# Patient Record
Sex: Female | Born: 1965
Health system: Southern US, Community
[De-identification: ages and names within clinical notes are randomized; demographics above are authoritative.]

## PROBLEM LIST (undated history)

## (undated) DIAGNOSIS — F419 Anxiety disorder, unspecified: Secondary | ICD-10-CM

## (undated) DIAGNOSIS — E785 Hyperlipidemia, unspecified: Secondary | ICD-10-CM

## (undated) DIAGNOSIS — M1990 Unspecified osteoarthritis, unspecified site: Secondary | ICD-10-CM

## (undated) DIAGNOSIS — Z8249 Family history of ischemic heart disease and other diseases of the circulatory system: Secondary | ICD-10-CM

## (undated) DIAGNOSIS — I10 Essential (primary) hypertension: Secondary | ICD-10-CM

## (undated) DIAGNOSIS — Z136 Encounter for screening for cardiovascular disorders: Secondary | ICD-10-CM

## (undated) DIAGNOSIS — J019 Acute sinusitis, unspecified: Secondary | ICD-10-CM

## (undated) DIAGNOSIS — R011 Cardiac murmur, unspecified: Secondary | ICD-10-CM

## (undated) DIAGNOSIS — R030 Elevated blood-pressure reading, without diagnosis of hypertension: Secondary | ICD-10-CM

## (undated) DIAGNOSIS — F32A Depression, unspecified: Secondary | ICD-10-CM

## (undated) DIAGNOSIS — F329 Major depressive disorder, single episode, unspecified: Secondary | ICD-10-CM

## (undated) DIAGNOSIS — E559 Vitamin D deficiency, unspecified: Secondary | ICD-10-CM

## (undated) DIAGNOSIS — K219 Gastro-esophageal reflux disease without esophagitis: Secondary | ICD-10-CM

## (undated) HISTORY — DX: Encounter for screening for cardiovascular disorders: Z13.6

## (undated) HISTORY — DX: Acute sinusitis, unspecified: J01.90

## (undated) HISTORY — DX: Vitamin D deficiency, unspecified: E55.9

## (undated) HISTORY — DX: Depression, unspecified: F32.A

## (undated) HISTORY — DX: Gastro-esophageal reflux disease without esophagitis: K21.9

## (undated) HISTORY — PX: CHOLECYSTECTOMY: SHX55

## (undated) HISTORY — DX: Hyperlipidemia, unspecified: E78.5

## (undated) HISTORY — DX: Elevated blood-pressure reading, without diagnosis of hypertension: R03.0

## (undated) HISTORY — DX: Family history of ischemic heart disease and other diseases of the circulatory system: Z82.49

## (undated) HISTORY — DX: Major depressive disorder, single episode, unspecified: F32.9

---

## 2005-01-30 ENCOUNTER — Emergency Department: Payer: Self-pay | Admitting: Emergency Medicine

## 2005-02-14 ENCOUNTER — Emergency Department: Payer: Self-pay | Admitting: Emergency Medicine

## 2005-05-23 ENCOUNTER — Ambulatory Visit: Payer: Self-pay | Admitting: Internal Medicine

## 2005-12-06 ENCOUNTER — Emergency Department (HOSPITAL_COMMUNITY): Admission: EM | Admit: 2005-12-06 | Discharge: 2005-12-06 | Payer: Self-pay | Admitting: Emergency Medicine

## 2009-06-30 LAB — CONVERTED CEMR LAB: HDL: 56 mg/dL

## 2009-07-01 ENCOUNTER — Ambulatory Visit: Payer: Self-pay | Admitting: Internal Medicine

## 2009-07-05 ENCOUNTER — Encounter: Payer: Self-pay | Admitting: Family Medicine

## 2009-07-07 LAB — CONVERTED CEMR LAB: Pap Smear: NORMAL

## 2009-07-28 LAB — HM MAMMOGRAPHY

## 2010-01-31 ENCOUNTER — Ambulatory Visit: Payer: Self-pay | Admitting: Family Medicine

## 2010-01-31 DIAGNOSIS — K219 Gastro-esophageal reflux disease without esophagitis: Secondary | ICD-10-CM | POA: Insufficient documentation

## 2010-01-31 DIAGNOSIS — I10 Essential (primary) hypertension: Secondary | ICD-10-CM | POA: Insufficient documentation

## 2010-01-31 DIAGNOSIS — E559 Vitamin D deficiency, unspecified: Secondary | ICD-10-CM

## 2010-01-31 DIAGNOSIS — F329 Major depressive disorder, single episode, unspecified: Secondary | ICD-10-CM

## 2010-01-31 DIAGNOSIS — F3289 Other specified depressive episodes: Secondary | ICD-10-CM | POA: Insufficient documentation

## 2010-01-31 DIAGNOSIS — J309 Allergic rhinitis, unspecified: Secondary | ICD-10-CM | POA: Insufficient documentation

## 2010-02-01 LAB — CONVERTED CEMR LAB: Vit D, 25-Hydroxy: 21 ng/mL — ABNORMAL LOW (ref 30–89)

## 2010-02-09 ENCOUNTER — Telehealth: Payer: Self-pay | Admitting: Family Medicine

## 2010-03-08 ENCOUNTER — Encounter: Payer: Self-pay | Admitting: Family Medicine

## 2010-04-05 ENCOUNTER — Encounter: Payer: Self-pay | Admitting: Family Medicine

## 2010-04-05 ENCOUNTER — Ambulatory Visit: Payer: Self-pay | Admitting: Gastroenterology

## 2010-04-07 LAB — PATHOLOGY REPORT

## 2010-07-15 ENCOUNTER — Emergency Department: Payer: Self-pay | Admitting: Internal Medicine

## 2010-07-19 ENCOUNTER — Ambulatory Visit
Admission: RE | Admit: 2010-07-19 | Discharge: 2010-07-19 | Payer: Self-pay | Source: Home / Self Care | Attending: Family Medicine | Admitting: Family Medicine

## 2010-07-19 DIAGNOSIS — Z9189 Other specified personal risk factors, not elsewhere classified: Secondary | ICD-10-CM | POA: Insufficient documentation

## 2010-07-19 DIAGNOSIS — J019 Acute sinusitis, unspecified: Secondary | ICD-10-CM | POA: Insufficient documentation

## 2010-07-19 DIAGNOSIS — J0140 Acute pansinusitis, unspecified: Secondary | ICD-10-CM | POA: Insufficient documentation

## 2010-07-19 NOTE — Consult Note (Signed)
Summary: [facility]  [facility]   Imported By: Lanelle Bal 04/13/2010 13:38:09  _____________________________________________________________________  External Attachment:    Type:   Image     Comment:   External Document

## 2010-07-19 NOTE — Letter (Signed)
Summary: Records from [facility] 2011  Records from [facility] 2011   Imported By: Maryln Gottron 02/03/2010 15:09:36  _____________________________________________________________________  External Attachment:    Type:   Image     Comment:   External Document

## 2010-07-19 NOTE — Progress Notes (Signed)
Summary: has heavy bleeding  Phone Note Call from Patient Call back at [facility] Phone [phone]   Caller: Patient Summary of Call: Pt reports she has had heavy vaginal bleeding, with clots, since yesterday morning.  States her periods have been very irregular.  She is asking for appt this week.  You dont have anything available, do you want to work her in?  She doesnt have a gyn. Initial call taken by: Lowella Petties CMA,  February 09, 2010 4:15 PM  Follow-up for Phone Call        I am not here tomorrow or Friday afternoon.  I could work her in at 10:30 am on Friday. Ruthe Mannan MD  February 10, 2010 12:03 AM  [facility] for pt to call back.     Lowella Petties CMA  February 10, 2010 9:35 AM   Additional Follow-up for Phone Call Additional follow up Details #1::        Spoke with pt, she said she is doing some better, she will see how she does over the weekend and will call back if she needs to. Additional Follow-up by: Lowella Petties CMA,  February 11, 2010 11:43 AM
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
## 2010-07-19 NOTE — Procedures (Signed)
Summary: Upper GI Endoscopy by Dr.Paul Oh,[facility]  Upper GI Endoscopy by Dr[facility]Paul Oh,[facility]   Imported By: Beau Fanny 04/07/2010 16:35:51  _____________________________________________________________________  External Attachment:    Type:   Image     Comment:   External Document

## 2010-07-21 ENCOUNTER — Encounter: Payer: Self-pay | Admitting: Family Medicine

## 2010-07-25 ENCOUNTER — Telehealth: Payer: Self-pay | Admitting: Family Medicine

## 2010-07-25 ENCOUNTER — Encounter: Payer: Self-pay | Admitting: Family Medicine
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
## 2010-07-28 ENCOUNTER — Telehealth: Payer: Self-pay | Admitting: Family Medicine

## 2010-08-01 ENCOUNTER — Encounter: Payer: Self-pay | Admitting: Family Medicine

## 2010-08-04 NOTE — Letter (Signed)
Summary: [facility]   Imported By: Kassie Mends 07/29/2010 09:47:39  _____________________________________________________________________  External Attachment:    Type:   Image     Comment:   External Document

## 2010-08-04 NOTE — Progress Notes (Signed)
Summary: ? Disability forms  ---- Converted from flag ---- ---- 07/25/2010 12:04 PM, Lowella Petties CMA, AAMA wrote: Pt is asking if Dr. Dayton Martes received faxed disability forms from insurance [phone] ------------------------------ Spoke with patient and she just wanted to give Dr. Dayton Martes the heads up that Monia Pouch will be faxing over disability forms to be filled out.  Linde Gillis CMA Duncan Dull)  July 25, 2010 12:11 PM

## 2010-08-10 ENCOUNTER — Telehealth (INDEPENDENT_AMBULATORY_CARE_PROVIDER_SITE_OTHER): Payer: Self-pay | Admitting: *Deleted
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
## 2010-08-10 NOTE — Letter (Signed)
Summary: Generic Letter  [place] [facility]  [address] [place], [place] [address]   Phone: [phone]  Fax: [phone]    08/01/2010   To Whom It May Concern:   Kayna Suppa is currently out of work under the care of Dr. Ruthe Mannan, [place].  Ms. Boulay care began on 07/19/2010.  If you have further questions please call our office at [phone].          Sincerely,      Dr. Ruthe Mannan

## 2010-08-10 NOTE — Letter (Signed)
Summary: Monia Pouch Attending Physician Statement  [employer] Attending Physician Statement   Imported By: Beau Fanny 08/02/2010 16:55:46  _____________________________________________________________________  External Attachment:    Type:   Image     Comment:   External Document

## 2010-08-15 ENCOUNTER — Other Ambulatory Visit: Payer: Self-pay | Admitting: Family Medicine

## 2010-08-15 ENCOUNTER — Other Ambulatory Visit (INDEPENDENT_AMBULATORY_CARE_PROVIDER_SITE_OTHER): Payer: BC Managed Care – PPO

## 2010-08-15 ENCOUNTER — Encounter (INDEPENDENT_AMBULATORY_CARE_PROVIDER_SITE_OTHER): Payer: Self-pay | Admitting: *Deleted

## 2010-08-15 DIAGNOSIS — E785 Hyperlipidemia, unspecified: Secondary | ICD-10-CM

## 2010-08-15 DIAGNOSIS — Z0000 Encounter for general adult medical examination without abnormal findings: Secondary | ICD-10-CM

## 2010-08-15 DIAGNOSIS — Z136 Encounter for screening for cardiovascular disorders: Secondary | ICD-10-CM

## 2010-08-15 DIAGNOSIS — E559 Vitamin D deficiency, unspecified: Secondary | ICD-10-CM

## 2010-08-15 LAB — LDL CHOLESTEROL, DIRECT: Direct LDL: 128 mg/dL

## 2010-08-15 LAB — LIPID PANEL
Total CHOL/HDL Ratio: 5
Triglycerides: 197 mg/dL — ABNORMAL HIGH (ref 0.0–149.0)
VLDL: 39.4 mg/dL (ref 0.0–40.0)

## 2010-08-15 LAB — BASIC METABOLIC PANEL
BUN: 8 mg/dL (ref 6–23)
Calcium: 8.9 mg/dL (ref 8.4–10.5)
Chloride: 106 mEq/L (ref 96–112)
Glucose, Bld: 78 mg/dL (ref 70–99)
Potassium: 4.2 mEq/L (ref 3.5–5.1)
Sodium: 140 mEq/L (ref 135–145)

## 2010-08-16 ENCOUNTER — Encounter: Payer: Self-pay | Admitting: Family Medicine

## 2010-08-16 NOTE — Progress Notes (Signed)
----   Converted from flag ---- ---- 08/09/2010 2:36 PM, Ruthe Mannan MD wrote: vit D (268.), fasting lipid panel (v81.0), BMET (v70.0)  ---- 08/09/2010 1:07 PM, Liane Comber CMA (AAMA) wrote: Lab orders please! Good Morning! This pt is scheduled for cpx labs Monday, which labs to draw and dx codes to use? Thanks Tasha ------------------------------

## 2010-08-17 LAB — CONVERTED CEMR LAB: Vit D, 25-Hydroxy: 26 ng/mL — ABNORMAL LOW (ref 30–89)

## 2010-08-18 ENCOUNTER — Encounter: Payer: Self-pay | Admitting: Family Medicine

## 2010-08-18 ENCOUNTER — Encounter (INDEPENDENT_AMBULATORY_CARE_PROVIDER_SITE_OTHER): Payer: BC Managed Care – PPO | Admitting: Family Medicine

## 2010-08-18 DIAGNOSIS — Z0000 Encounter for general adult medical examination without abnormal findings: Secondary | ICD-10-CM

## 2010-08-22 ENCOUNTER — Telehealth: Payer: Self-pay | Admitting: Family Medicine

## 2010-08-25 NOTE — Letter (Addendum)
Summary: Out of Work  [employer] [facility]  [address] [place], [place] [address]   Phone: [phone]  Fax: [phone]    August 18, 2010   Employee:  Marylene Land Kaser    To Whom It May Concern:   Ms. Romano can return to work on September 05, 2010 without restriction.   If you need additional information, please feel free to contact our office.         Sincerely,    Ruthe Mannan MD  Appended Document: Out of Work Letter faxed to her employer at [phone].  Appended Document: Out of Work Patient called back to give correct fax number which is [phone].  Note refaxed to correct number.

## 2010-08-25 NOTE — Assessment & Plan Note (Signed)
Summary: CPX WITH PAP/RBH   Vital Signs:  Patient profile:   45 year old female Height:      63 inches Weight:      241.75 pounds BMI:     42.98 Temp:     98.6 degrees F oral Pulse rate:   68 / minute Pulse rhythm:   regular BP sitting:   110 / 80  (right arm) Cuff size:   large  Vitals Entered By: Linde Gillis CMA Duncan Dull) (August 18, 2010 8:59 AM) CC: complete physicial, no pap   History of Present Illness: 45 yo here for CPX without pap (has OBGYN).  Overall, doing well.  Has two more weeks of PT.  Feels it has been helping with her neck pain.  Discussed labs with her today- lipid panel- TG a bit high.  She wants to work on diet and exercise.  Vit D deficiency- s/p multiple rounds of high dose 50,000 International Units of Vitamin D courses.  Vit D much improved, was 10 in 06/2009.  Still a little low now at 26.  She feels much better than she did in 06/2009.    Due for colonoscopy.  Current Medications (verified): 1)  Vitamin C 500 Mg Chew (Ascorbic Acid) .... Take One Tablet By Mouth Daily 2)  Vitamin D3 1000 Unit  Tabs (Cholecalciferol) .... 2 By Mouth Daily 3)  Vitamin E 400 Unit Caps (Vitamin E) .... Take One Tablet By Mouth Daily 4)  Vitamin B-12 1000 Mcg Tabs (Cyanocobalamin) .... Take One Tablet By Mouth Daily 5)  Nexium 40 Mg Cpdr (Esomeprazole Magnesium) .... Take One Tablet By Mouth Daily 6)  Cyclobenzaprine Hcl 10 Mg  Tabs (Cyclobenzaprine Hcl) .Marland Kitchen.. 1 By Mouth 2 Times Daily As Needed For Back Pain 7)  Vicodin 5-500 Mg Tabs (Hydrocodone-Acetaminophen) .Marland Kitchen.. 1 Tab Every 6 Hours As Needed For Pain  Allergies: 1)  Sulfa  Past History:  Past Medical History: Last updated: 01/31/2010 Depression GERD Vitamin D deficiency Allergic rhinitis  Past Surgical History: Last updated: 01/31/2010 Cholecystectomy  Family History: Last updated: 01/31/2010 Family History of CAD Female 1st degree relative <60- mom had MI in her 93s. Dad- h/o CVA  Social History: Last  updated: 01/31/2010 Single No children works for [employer] Never Smoked Alcohol use-yes Regular exercise-no  Risk Factors: Exercise: no (01/31/2010)  Risk Factors: Smoking Status: never (01/31/2010)  Review of Systems      See HPI General:  Denies fever. Eyes:  Denies blurring. ENT:  Denies difficulty swallowing. CV:  Denies chest pain or discomfort. Resp:  Denies shortness of breath. GI:  Denies abdominal pain and change in bowel habits. GU:  Denies dysuria. MS:  Denies muscle weakness. Derm:  Denies rash. Neuro:  Denies visual disturbances. Psych:  Denies anxiety and depression. Endo:  Denies cold intolerance and heat intolerance.  Physical Exam  General:  alert, well-developed, and overweight-appearing.   Head:  normocephalic, atraumatic, no abnormalities observed, and no abnormalities palpated.   Eyes:  vision grossly intact, pupils equal, pupils round, and pupils reactive to light.   Ears:  R ear normal and L ear normal.   Nose:  External nasal examination shows no deformity or inflammation. Nasal mucosa are pink and moist without lesions or exudates. Mouth:  pharyngeal erythema.   Neck:  No deformities, masses, or tenderness noted. full ROM but does have discomfort with resistance.   Breasts:  deferred (OBYN) Lungs:  Normal respiratory effort, chest expands symmetrically. Lungs are clear to auscultation, no crackles  or wheezes. Heart:  Normal rate and regular rhythm. S1 and S2 normal without gallop, murmur, click, rub or other extra sounds. Abdomen:  soft, non-tender, and normal bowel sounds.   Rectal:  deferred (OBGYN)  Genitalia:  deferred (OBGYN) Msk:  No deformity or scoliosis noted of thoracic or lumbar spine.   Extremities:  no edema Neurologic:  alert & oriented X3 and gait normal.   Skin:  Intact without suspicious lesions or rashes Psych:  Cognition and judgment appear intact. Alert and cooperative with normal attention span and concentration. No apparent  delusions, illusions, hallucinations   Impression & Recommendations:  Problem # 1:  ROUTINE GENERAL MEDICAL EXAM@HEALTH  CARE FACL (ICD-V70.0) Reviewed preventive care protocols, scheduled due services, and updated immunizations Discussed nutrition, exercise, diet, and healthy lifestyle.  set up mammogram today.  Problem # 2:  VITAMIN D DEFICIENCY (ICD-268.9) Assessment: Improved will increase her current 400 International Units supplmentation to 2000 International Units supplementation daily and recheck Vit D in 3 months.  Complete Medication List: 1)  Vitamin C 500 Mg Chew (Ascorbic acid) .... Take one tablet by mouth daily 2)  Vitamin D3 1000 Unit Tabs (Cholecalciferol) .... 2 by mouth daily 3)  Vitamin E 400 Unit Caps (Vitamin e) .... Take one tablet by mouth daily 4)  Vitamin B-12 1000 Mcg Tabs (Cyanocobalamin) .... Take one tablet by mouth daily 5)  Nexium 40 Mg Cpdr (Esomeprazole magnesium) .... Take one tablet by mouth daily 6)  Cyclobenzaprine Hcl 10 Mg Tabs (Cyclobenzaprine hcl) .Marland Kitchen.. 1 by mouth 2 times daily as needed for back pain 7)  Vicodin 5-500 Mg Tabs (Hydrocodone-acetaminophen) .Marland Kitchen.. 1 tab every 6 hours as needed for pain  Other Orders: Radiology Referral (Radiology)  Patient Instructions: 1)  Please stop by to see [name] on your way out.   Prescriptions: VITAMIN D3 1000 UNIT  TABS (CHOLECALCIFEROL) 2 by mouth daily  #60 x 3   Entered and Authorized by:   Ruthe Mannan MD   Signed by:   Ruthe Mannan MD on 08/18/2010   Method used:   Electronically to        [facility] (retail)       [address], [address]       [place], [place]  [address]       Ph: [phone]       Fax: [phone]   RxID:   [id number]    Orders Added: 1)  Radiology Referral [Radiology] 2)  Est. Patient 40-64 years [phone]    Current Allergies (reviewed today): SULFA     Prevention & Chronic Care Immunizations   Influenza vaccine:  Not documented   Influenza vaccine due: Not Indicated    Tetanus booster: 01/29/2008: historical   Tetanus booster due: 01/28/2018    Pneumococcal vaccine: Not documented  Other Screening   Pap smear: normal  (07/07/2009)   Pap smear due: 07/08/2011    Mammogram: historical  (07/28/2009)   Mammogram due: 07/28/2010   Smoking status: never  (01/31/2010)  Lipids   Total Cholesterol: 216  (08/15/2010)   LDL: 110  (01/28/2010)   LDL Direct: 128.0  (08/15/2010)   HDL: 42.30  (08/15/2010)   Triglycerides: 197.0  (08/15/2010)

## 2010-08-29 ENCOUNTER — Encounter: Payer: Self-pay | Admitting: Family Medicine
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
## 2010-09-06 NOTE — Miscellaneous (Signed)
Summary: Recertification Treatment Plan/[facility]  Recertification Treatment Plan/[facility]   Imported By: Maryln Gottron 09/02/2010 14:28:28  _____________________________________________________________________  External Attachment:    Type:   Image     Comment:   External Document

## 2010-09-14 ENCOUNTER — Ambulatory Visit: Payer: Self-pay | Admitting: Family Medicine

## 2010-09-21 ENCOUNTER — Encounter: Payer: Self-pay | Admitting: Family Medicine

## 2010-09-22 ENCOUNTER — Telehealth: Payer: Self-pay | Admitting: *Deleted

## 2010-09-22 NOTE — Telephone Encounter (Signed)
Needs to see ortho first. We can place referral if that is what she would like.

## 2010-09-22 NOTE — Telephone Encounter (Signed)
Patient is requesting a MRI.  She stated that her lawyer advised that she have this done.  Please advise.

## 2010-09-27 ENCOUNTER — Other Ambulatory Visit: Payer: Self-pay | Admitting: Family Medicine

## 2010-09-27 DIAGNOSIS — M542 Cervicalgia: Secondary | ICD-10-CM

## 2010-09-27 NOTE — Telephone Encounter (Signed)
Referral placed.

## 2010-09-27 NOTE — Telephone Encounter (Signed)
Patient advised. She would like referral to ortho.

## 2010-10-06 ENCOUNTER — Encounter: Payer: Self-pay | Admitting: *Deleted

## 2010-11-21 ENCOUNTER — Other Ambulatory Visit: Payer: BC Managed Care – PPO

## 2010-11-29 ENCOUNTER — Other Ambulatory Visit: Payer: Self-pay | Admitting: Family Medicine

## 2010-12-06 ENCOUNTER — Other Ambulatory Visit (INDEPENDENT_AMBULATORY_CARE_PROVIDER_SITE_OTHER): Payer: BC Managed Care – PPO | Admitting: Family Medicine

## 2010-12-06 DIAGNOSIS — E559 Vitamin D deficiency, unspecified: Secondary | ICD-10-CM

## 2011-03-22 IMAGING — CR DG CHEST 2V
1 series · 2 of 2 positions shown · non-contrast
Comparison: none

REASON FOR EXAM: cough  congestion
COMMENTS:

PROCEDURE:     DXR - DXR CHEST PA (OR AP) AND LATERAL  - July 01, 2009  [DATE]
RESULT:     The lung fields are clear. No pneumonia, pneumothorax or pleural
effusion is seen. Heart size is normal. The mediastinal and osseous
structures reveal no significant abnormalities.

[Series 1: view not recorded · 0.17mm/px · 2 of 2 slices shown]
[im 1/2]
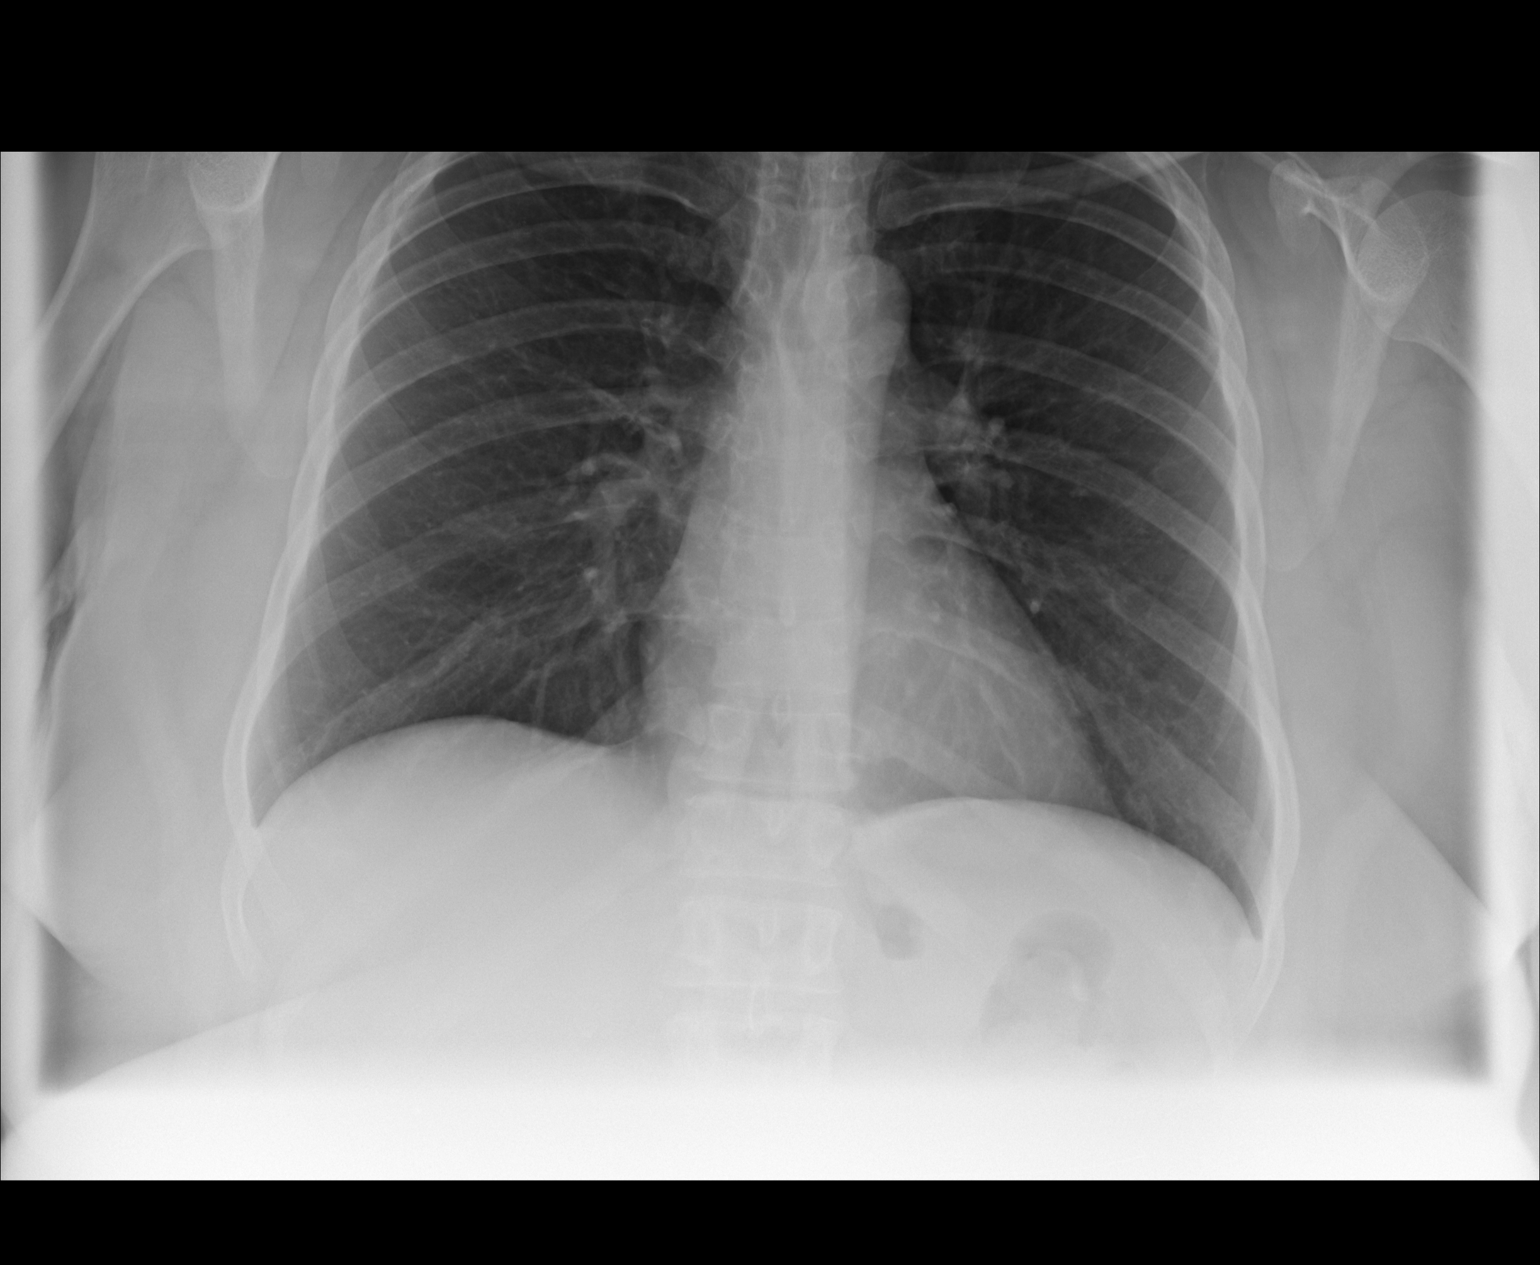
[im 2/2]
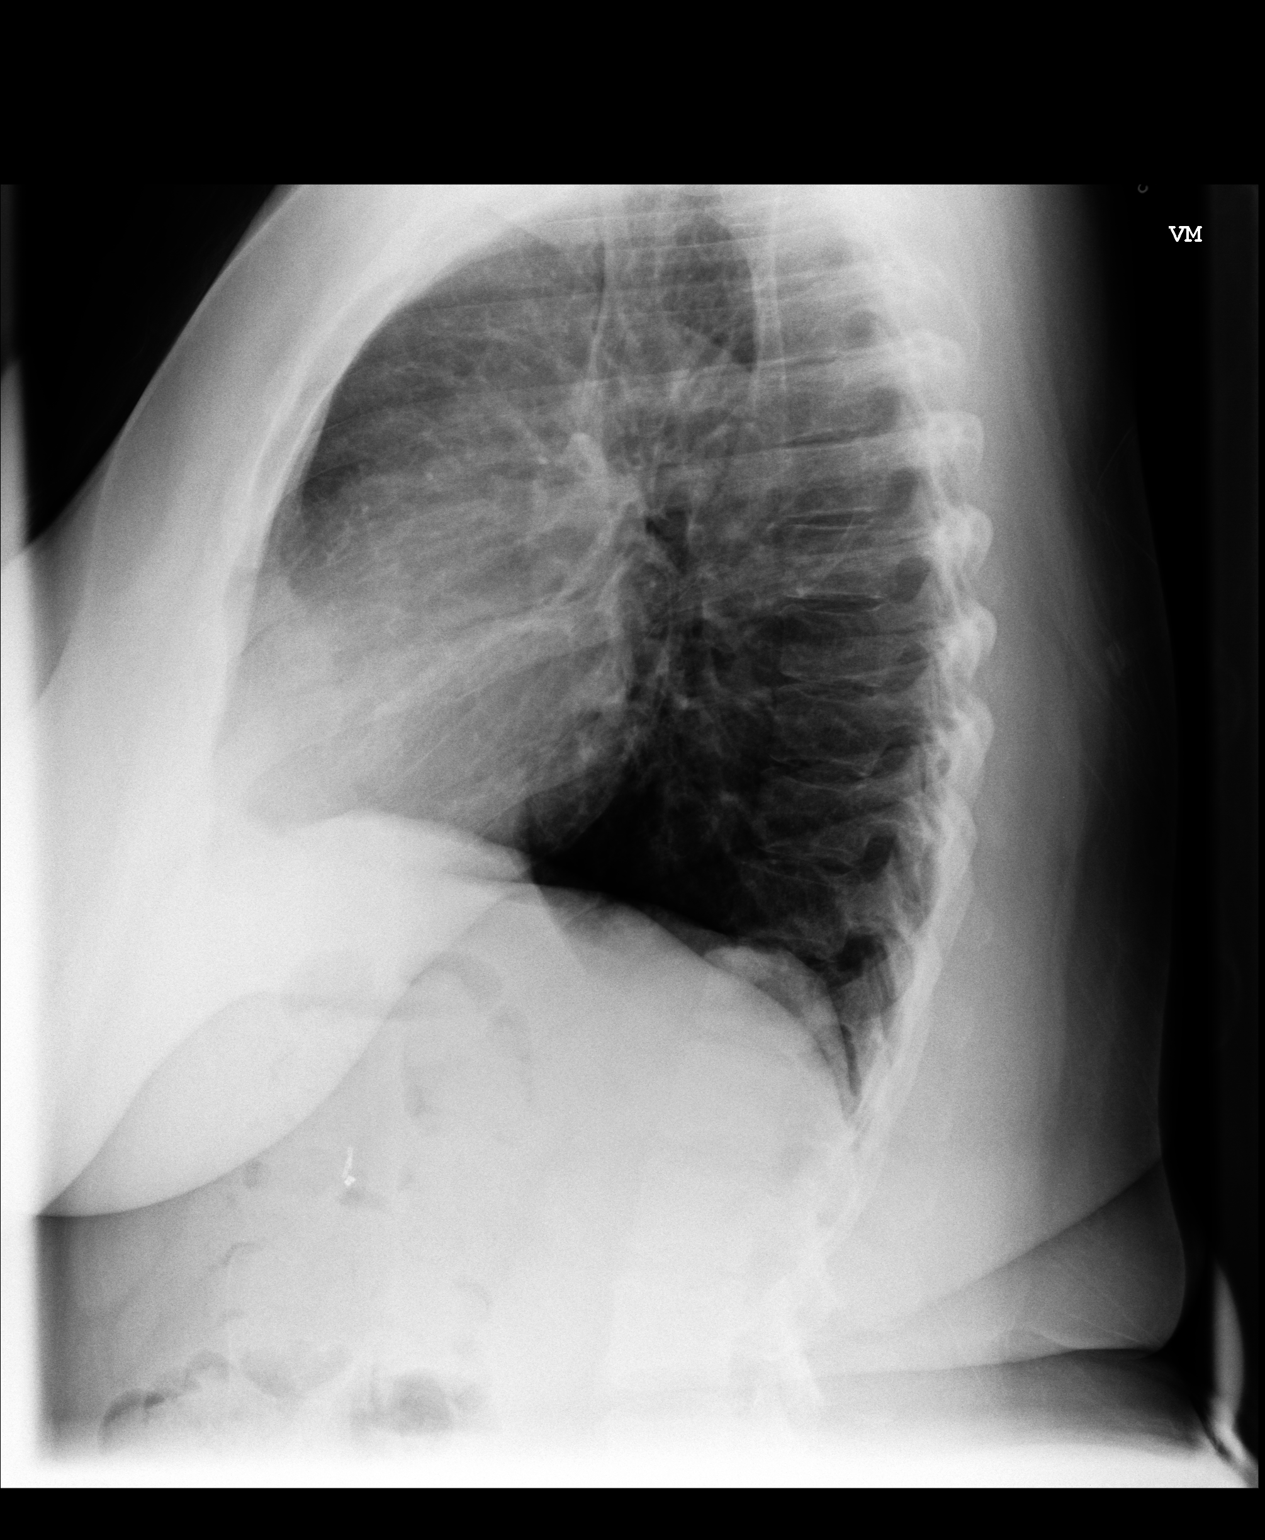

[2 of 2 positions shown; findings below may reference images not displayed]

IMPRESSION: No significant abnormalities are noted.

## 2011-03-22 IMAGING — CT CT PARANASAL SINUSES W/O CM
1 series · 16 of 24 positions shown, 20 images · non-contrast
Comparison: none

REASON FOR EXAM: Paranasal Sinus Infection
COMMENTS:

[Series 2: sinus bone windows · axial · 0.26mm/px · z∈[+132,+241]mm · 16 of 24 slices shown, 20 images]
[im 2/24  brain]
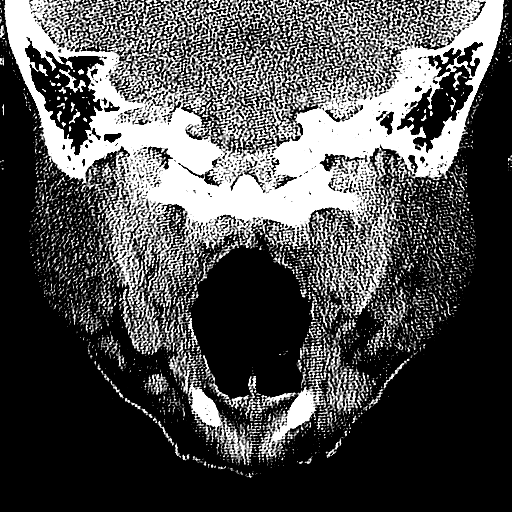
[im 2/24  bone]
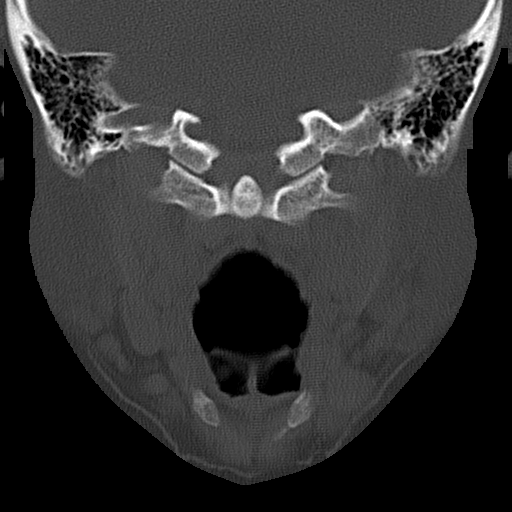
[im 4/24  bone]
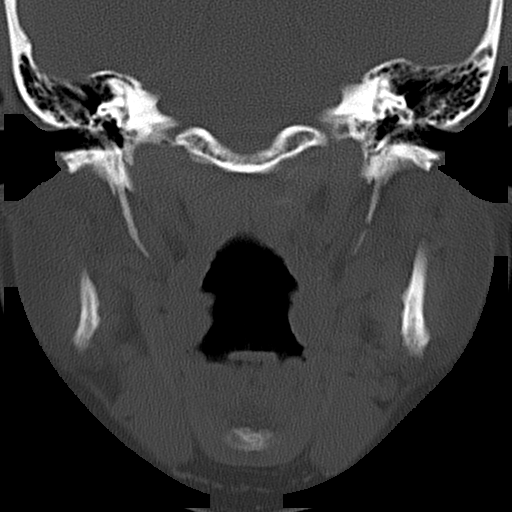
[im 5/24  bone]
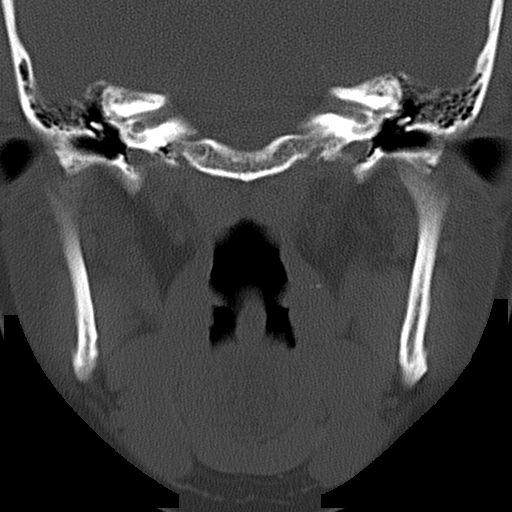
[im 6/24  bone]
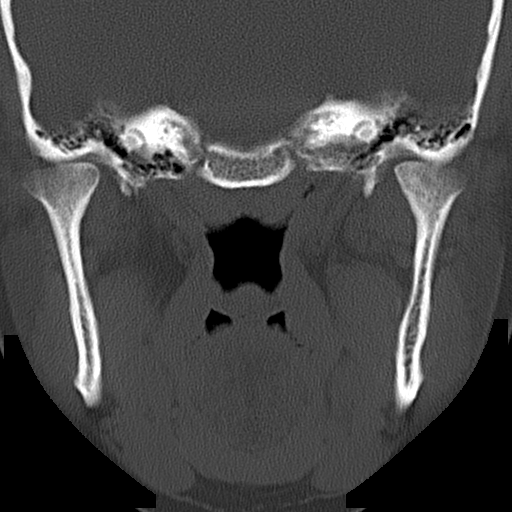
[im 8/24  brain]
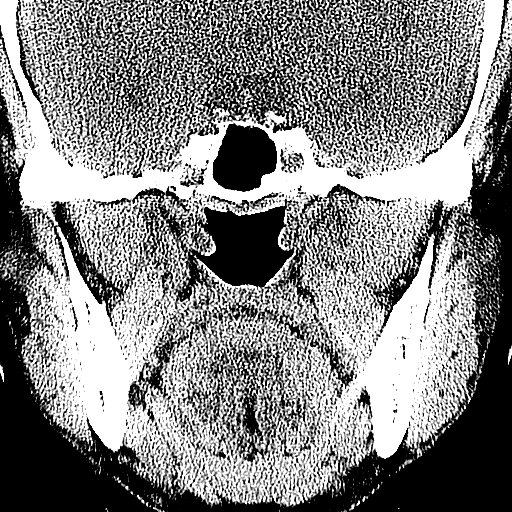
[im 8/24  bone]
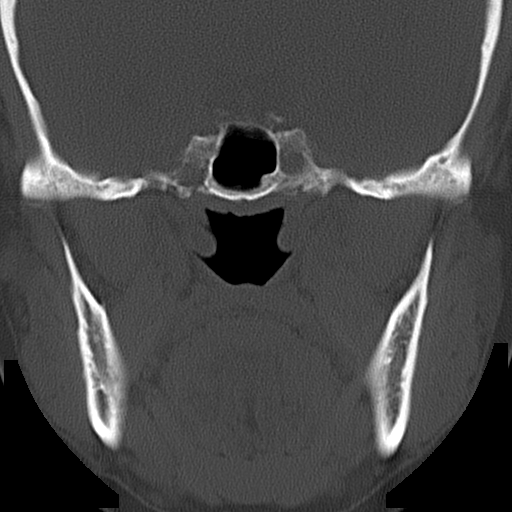
[im 9/24  bone]
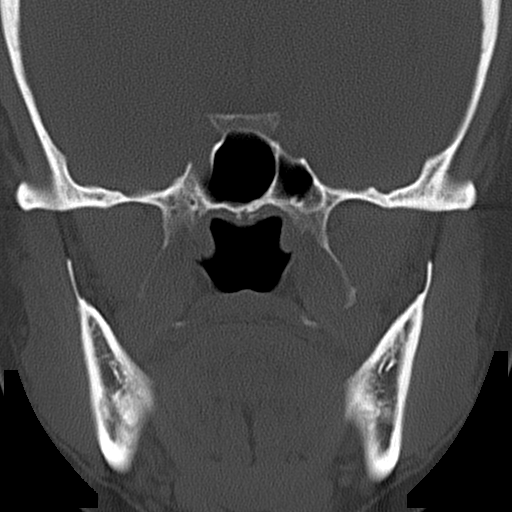
[im 10/24  bone]
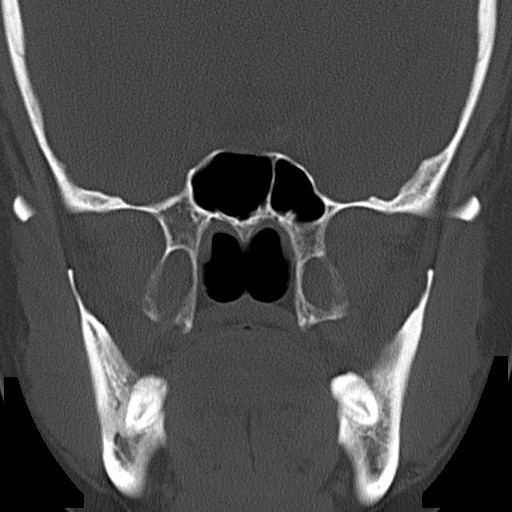
[im 12/24  bone]
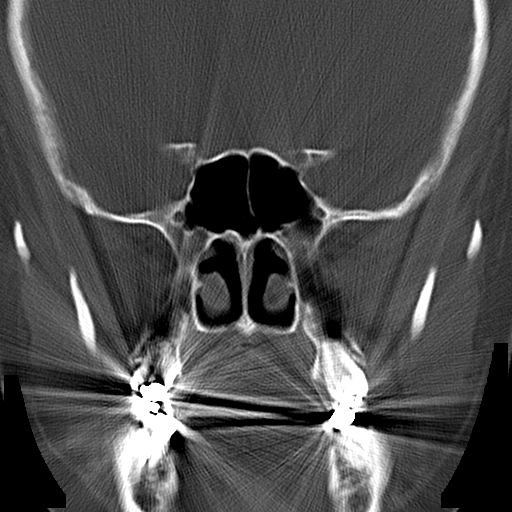
[im 13/24  brain]
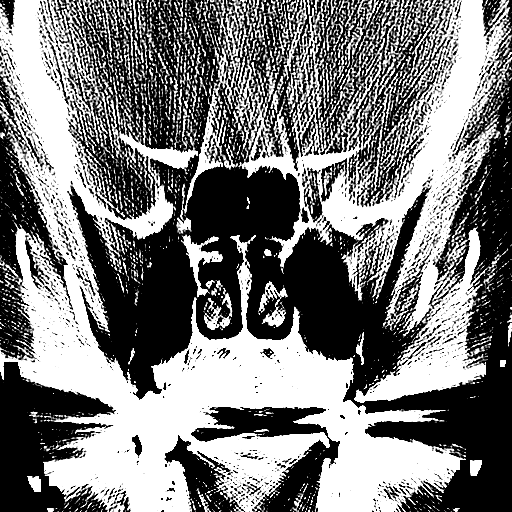
[im 13/24  bone]
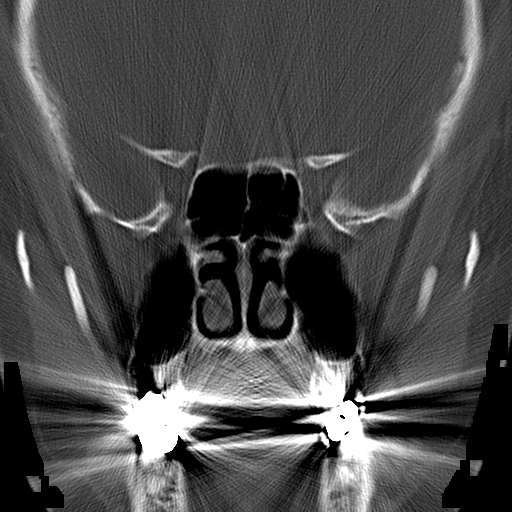
[im 15/24  bone]
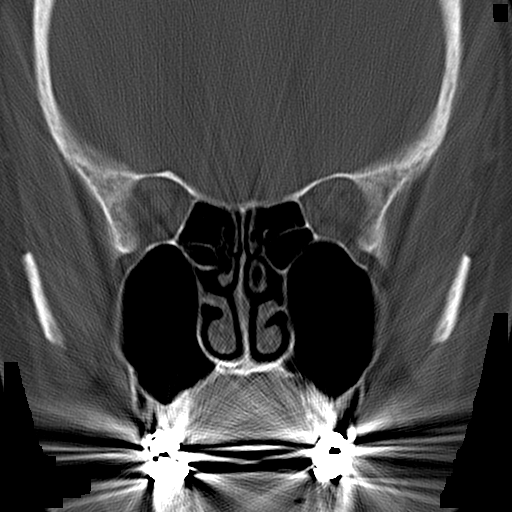
[im 16/24  bone]
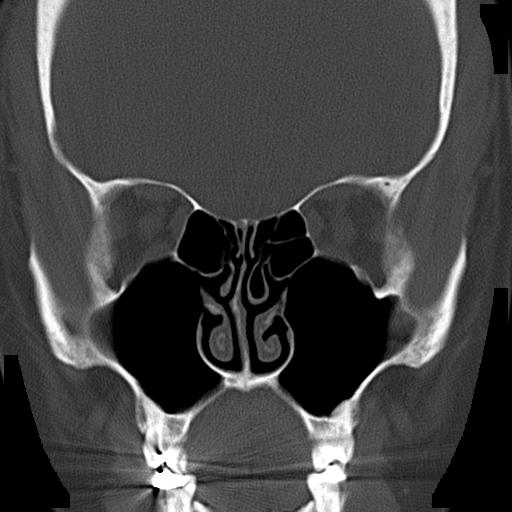
[im 17/24  bone]
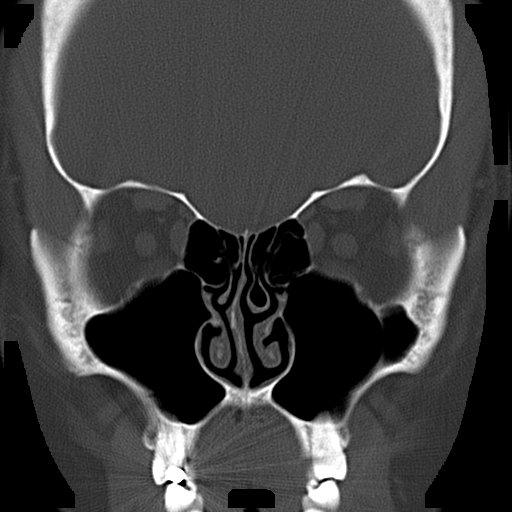
[im 19/24  brain]
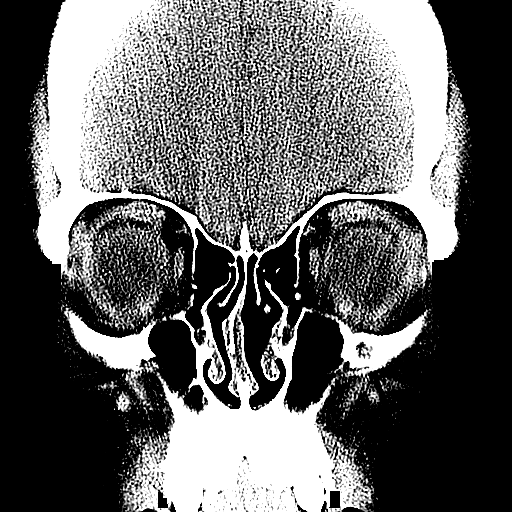
[im 19/24  bone]
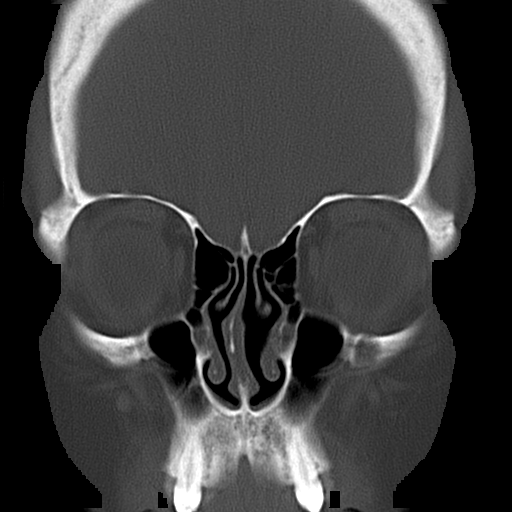
[im 20/24  bone]
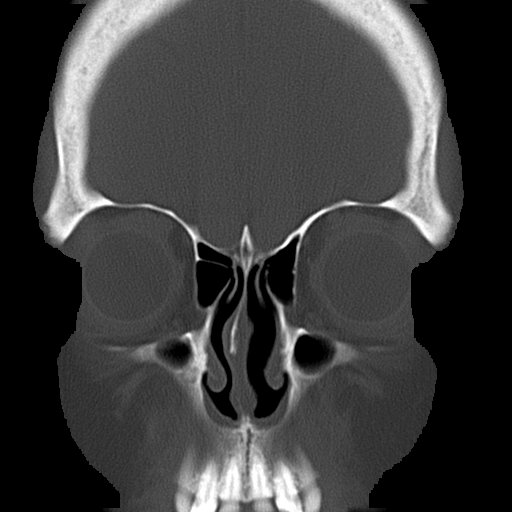
[im 21/24  bone]
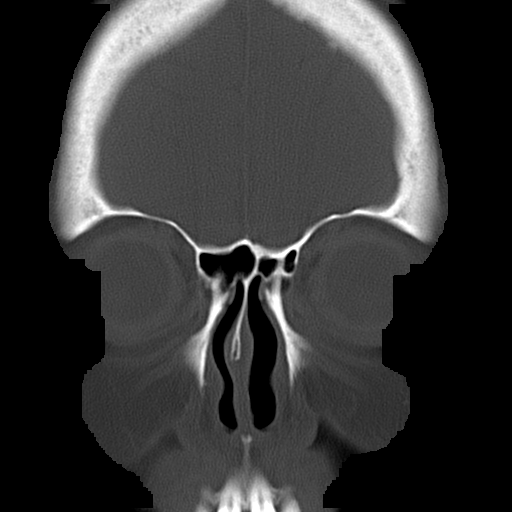
[im 23/24  bone]
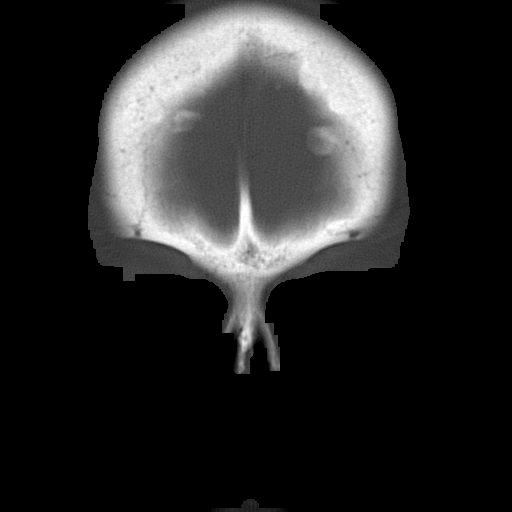

[16 of 24 positions shown; findings below may reference images not displayed]

PROCEDURE:     CT  - CT SINUSES WITHOUT CONTRAST  - July 01, 2009  [DATE]

RESULT:     A direct coronal sinus CT scan was performed through the
paranasal sinuses.

The frontal sinuses are hypoplastic. The ethmoid, maxillary, and sphenoid
sinuses are clear. The nasal septum is deviated toward the right. The
ostiomeatal units are patent and the nasal passages appear patent. There is
a concha bullosa of the middle turbinate on the left.
IMPRESSION: 1. I do not see evidence of air-fluid levels or mucoperiosteal thickening of
the paranasal sinuses.
2. The ostiomeatal units appear patent.
3. The frontal sinuses are hypoplastic.

## 2011-08-28 ENCOUNTER — Encounter: Payer: Self-pay | Admitting: Family Medicine

## 2011-08-31 ENCOUNTER — Encounter: Payer: Self-pay | Admitting: Family Medicine

## 2011-08-31 ENCOUNTER — Ambulatory Visit (INDEPENDENT_AMBULATORY_CARE_PROVIDER_SITE_OTHER): Payer: BC Managed Care – PPO | Admitting: Family Medicine

## 2011-08-31 VITALS — BP 150/98 | HR 96 | Temp 98.0°F | Ht 61.25 in | Wt 228.0 lb

## 2011-08-31 DIAGNOSIS — Z0000 Encounter for general adult medical examination without abnormal findings: Secondary | ICD-10-CM

## 2011-08-31 DIAGNOSIS — F4321 Adjustment disorder with depressed mood: Secondary | ICD-10-CM

## 2011-08-31 DIAGNOSIS — J019 Acute sinusitis, unspecified: Secondary | ICD-10-CM

## 2011-08-31 DIAGNOSIS — Z1231 Encounter for screening mammogram for malignant neoplasm of breast: Secondary | ICD-10-CM

## 2011-08-31 DIAGNOSIS — Z136 Encounter for screening for cardiovascular disorders: Secondary | ICD-10-CM

## 2011-08-31 DIAGNOSIS — E559 Vitamin D deficiency, unspecified: Secondary | ICD-10-CM

## 2011-08-31 LAB — LIPID PANEL
Cholesterol: 214 mg/dL — ABNORMAL HIGH (ref 0–200)
HDL: 53 mg/dL (ref >39.00)

## 2011-08-31 LAB — COMPREHENSIVE METABOLIC PANEL
AST: 15 U/L (ref 0–37)
BUN: 8 mg/dL (ref 6–23)
Calcium: 9.4 mg/dL (ref 8.4–10.5)
Chloride: 103 mEq/L (ref 96–112)
Creatinine, Ser: 0.9 mg/dL (ref 0.4–1.2)
GFR: 83.41 mL/min (ref >60.00)
Total Protein: 7.6 g/dL (ref 6.0–8.3)

## 2011-08-31 LAB — LDL CHOLESTEROL, DIRECT: Direct LDL: 113.7 mg/dL

## 2011-08-31 MED ORDER — ALPRAZOLAM 0.25 MG PO TABS: 0.2500 mg | ORAL_TABLET | Freq: Three times a day (TID) | ORAL | Status: AC | PRN

## 2011-08-31 MED ORDER — FLUOXETINE HCL 20 MG PO TABS: 20.0000 mg | ORAL_TABLET | Freq: Every day | ORAL | Status: AC

## 2011-08-31 NOTE — Patient Instructions (Signed)
It was nice to see you, Kendra Wang. Please stop by to see Kendra Wang on your way out to set up your appointment with Dr. Laymond Purser.  Fluoxetine capsules or tablets (Depression/Mood Disorders) What is this medicine? FLUOXETINE (floo OX e teen) belongs to a class of drugs known as selective serotonin reuptake inhibitors (SSRIs). It helps to treat mood problems such as depression, obsessive compulsive disorder, and panic attacks. It can also treat certain eating disorders. This medicine may be used for other purposes; ask your health care provider or pharmacist if you have questions. What should I tell my health care provider before I take this medicine? They need to know if you have any of these conditions: -bipolar disorder or mania -diabetes -glaucoma -liver disease -psychosis -seizures -suicidal thoughts or history of attempted suicide -an unusual or allergic reaction to fluoxetine, other medicines, foods, dyes, or preservatives -pregnant or trying to get pregnant -breast-feeding How should I use this medicine? Take this medicine by mouth with a glass of water. Follow the directions on the prescription label. You can take this medicine with or without food. Take your medicine at regular intervals. Do not take it more often than directed. Do not stop taking except on your doctor's advice. A special MedGuide will be given to you by the pharmacist with each prescription and refill. Be sure to read this information carefully each time. Talk to your pediatrician regarding the use of this medicine in children. While this drug may be prescribed for children as young as 7 years for selected conditions, precautions do apply. Overdosage: If you think you have taken too much of this medicine contact a poison control center or emergency room at once. NOTE: This medicine is only for you. Do not share this medicine with others. What if I miss a dose? If you miss a dose, skip the missed dose and go back to your  regular dosing schedule. Do not take double or extra doses. What may interact with this medicine? Do not take fluoxetine with any of the following medications: -other medicines containing fluoxetine, like Sarafem or Symbyax -certain diet drugs like dexfenfluramine, fenfluramine, phentermine -cisapride -linezolid -medicines called MAO Inhibitors like Azilect, Carbex, Eldepryl, Marplan, Nardil, and Parnate -methylene blue -pimozide -procarbazine -thioridazine -tryptophan Fluoxetine may also interact with the following medications: -alcohol -any other medicines for depression, anxiety, or psychotic disturbances -aspirin and aspirin-like medicines -carbamazepine -cyproheptadine -dextromethorphan -flecainide -lithium -medicines for diabetes -medicines for migraine headache, like sumatriptan -medicines for sleep -medicines that treat or prevent blood clots like warfarin, enoxaparin, and dalteparin -metoprolol -NSAIDs, medicines for pain and inflammation, like ibuprofen or naproxen -phenytoin -propafenone -propranolol -St. John's wort -vinblastine This list may not describe all possible interactions. Give your health care provider a list of all the medicines, herbs, non-prescription drugs, or dietary supplements you use. Also tell them if you smoke, drink alcohol, or use illegal drugs. Some items may interact with your medicine. What should I watch for while using this medicine? Visit your doctor or health care professional for regular checks on your progress. Continue to take your medicine even if you do not immediately feel better. It can take several weeks before you notice the full effect of this medicine. Patients and their families should watch out for worsening depression or thoughts of suicide. Also watch out for any sudden or severe changes in feelings such as feeling anxious, agitated, panicky, irritable, hostile, aggressive, impulsive, severely restless, overly excited and  hyperactive, or not being able to sleep. If this  happens, especially at the beginning of treatment or after a change in dose, call your doctor. You may get drowsy or dizzy. Do not drive, use machinery, or do anything that needs mental alertness until you know how this medicine affects you. Do not stand or sit up quickly, especially if you are an older patient. This reduces the risk of dizzy or fainting spells. Alcohol can make you more drowsy and dizzy. Avoid alcoholic drinks. Your mouth may get dry. Chewing sugarless gum or sucking hard candy, and drinking plenty of water may help. Contact your doctor if the problem does not go away or is severe. If you have diabetes, this medicine may affect blood sugar levels. Check your blood sugar. Talk to your doctor or health care professional if you notice changes. If you have been taking this medicine regularly for some time, do not suddenly stop taking it. You must gradually reduce the dose or you may get side effects or have a worsening of your condition. Ask your doctor or health care professional for advice. Do not treat yourself for coughs, colds or allergies without asking your doctor or health care professional for advice. Some ingredients can increase possible side effects. What side effects may I notice from receiving this medicine? Side effects that you should report to your doctor or health care professional as soon as possible: -allergic reactions like skin rash, itching or hives, swelling of the face, lips, or tongue -breathing problems -confusion -fast or irregular heart rate, palpitations -flu-like fever, chills, cough, muscle or joint aches and pains -seizures -suicidal thoughts or other mood changes -tremors -trouble sleeping -unusual bleeding or bruising -unusually tired or weak -vomiting Side effects that usually do not require medical attention (report to your doctor or health care professional if they continue or are  bothersome): -blurred vision -change in sex drive or performance -diarrhea -dry mouth -flushing -headache -increased or decreased appetite -nausea -sweating This list may not describe all possible side effects. Call your doctor for medical advice about side effects. You may report side effects to FDA at 1-800-FDA-1088. Where should I keep my medicine? Keep out of the reach of children. Store at room temperature between 15 and 30 degrees C (59 and 86 degrees F). Throw away any unused medicine after the expiration date. NOTE: This sheet is a summary. It may not cover all possible information. If you have questions about this medicine, talk to your doctor, pharmacist, or health care provider.  2012, Elsevier/Gold Standard. (01/14/2010 3:23:25 PM)

## 2011-08-31 NOTE — Progress Notes (Signed)
46 yo was here for CPX but would like to discuss depression.  Just found out mom has stage IV terminal cancer, likely bile duct. Since finding out the diagnosis, Ms. Petitfrere is very anxious, tearful, cannot concentrate. No SI or HI. She is not sure how to cope with the news without upsetting her mother.   Sleeping ok, appetite has decreased.  Review of Systems  See HPI   Physical Exam  BP 150/98  Pulse 96  Temp(Src) 98 F (36.7 C) (Oral)  Ht 5' 1.25" (1.556 m)  Wt 228 lb (103.42 kg)  BMI 42.73 kg/m2 BP Readings from Last 3 Encounters:  08/31/11 150/98  08/18/10 110/80  07/19/10 130/90    General: alert, well-developed, and overweight-appearing.  Pscyh:  Alert, tearful, good eye contact and appropriate.   Assessment and Plan: 1. Grief reaction  Ambulatory referral to Psychology   New.   >25 min spent with face to face with patient, >50% counseling and/or coordinating care. Start Prozac 20 mg daily with as needed Xanax. Refer to Dr. Laymond Purser for psychotherapy.

## 2011-09-01 ENCOUNTER — Other Ambulatory Visit: Payer: Self-pay | Admitting: *Deleted

## 2011-09-01 MED ORDER — VITAMIN D (ERGOCALCIFEROL) 1.25 MG (50000 UNIT) PO CAPS: 50000.0000 [IU] | ORAL_CAPSULE | ORAL | Status: DC

## 2011-09-20 ENCOUNTER — Ambulatory Visit: Payer: PRIVATE HEALTH INSURANCE | Admitting: Psychology

## 2012-09-02 ENCOUNTER — Encounter: Payer: PRIVATE HEALTH INSURANCE | Admitting: Family Medicine

## 2013-10-11 ENCOUNTER — Emergency Department: Payer: Self-pay | Admitting: Internal Medicine

## 2013-10-11 LAB — CBC WITH DIFFERENTIAL/PLATELET
BASOS ABS: 0.1 10*3/uL (ref 0.0–0.1)
Basophil %: 0.7 %
EOS PCT: 16 %
Eosinophil #: 1.3 10*3/uL — ABNORMAL HIGH (ref 0.0–0.7)
HCT: 47.6 % — ABNORMAL HIGH (ref 35.0–47.0)
HGB: 15.7 g/dL (ref 12.0–16.0)
LYMPHS ABS: 4.1 10*3/uL — AB (ref 1.0–3.6)
LYMPHS PCT: 52.6 %
MCH: 29.8 pg (ref 26.0–34.0)
MCHC: 32.9 g/dL (ref 32.0–36.0)
MCV: 91 fL (ref 80–100)
Monocyte #: 0.5 x10 3/mm (ref 0.2–0.9)
Monocyte %: 6.9 %
Neutrophil #: 1.9 10*3/uL (ref 1.4–6.5)
Neutrophil %: 23.8 %
Platelet: 292 10*3/uL (ref 150–440)
RBC: 5.26 10*6/uL — ABNORMAL HIGH (ref 3.80–5.20)
RDW: 14.7 % — ABNORMAL HIGH (ref 11.5–14.5)
WBC: 7.9 10*3/uL (ref 3.6–11.0)

## 2013-10-11 LAB — COMPREHENSIVE METABOLIC PANEL
ALBUMIN: 3.6 g/dL (ref 3.4–5.0)
AST: 31 U/L (ref 15–37)
Alkaline Phosphatase: 115 U/L
Anion Gap: 4 — ABNORMAL LOW (ref 7–16)
BUN: 9 mg/dL (ref 7–18)
Bilirubin,Total: 0.4 mg/dL (ref 0.2–1.0)
CREATININE: 0.74 mg/dL (ref 0.60–1.30)
Calcium, Total: 9 mg/dL (ref 8.5–10.1)
Chloride: 107 mmol/L (ref 98–107)
Co2: 29 mmol/L (ref 21–32)
GLUCOSE: 85 mg/dL (ref 65–99)
OSMOLALITY: 277 (ref 275–301)
Potassium: 4.8 mmol/L (ref 3.5–5.1)
SGPT (ALT): 23 U/L (ref 12–78)
SODIUM: 140 mmol/L (ref 136–145)
Total Protein: 7.8 g/dL (ref 6.4–8.2)

## 2013-10-11 LAB — URINALYSIS, COMPLETE
Bacteria: NONE SEEN
Bilirubin,UR: NEGATIVE
Blood: NEGATIVE
GLUCOSE, UR: NEGATIVE mg/dL (ref 0–75)
Ketone: NEGATIVE
NITRITE: NEGATIVE
PH: 6 (ref 4.5–8.0)
Protein: NEGATIVE
Specific Gravity: 1.02 (ref 1.003–1.030)

## 2013-10-11 LAB — LIPASE, BLOOD: LIPASE: 116 U/L (ref 73–393)

## 2013-10-11 LAB — TROPONIN I

## 2013-11-06 ENCOUNTER — Telehealth: Payer: Self-pay | Admitting: Family Medicine

## 2013-11-06 ENCOUNTER — Ambulatory Visit (INDEPENDENT_AMBULATORY_CARE_PROVIDER_SITE_OTHER): Payer: BC Managed Care – PPO | Admitting: Family Medicine

## 2013-11-06 ENCOUNTER — Encounter: Payer: Self-pay | Admitting: Family Medicine

## 2013-11-06 VITALS — BP 132/88 | HR 76 | Temp 98.2°F | Ht 61.0 in | Wt 231.8 lb

## 2013-11-06 DIAGNOSIS — R1013 Epigastric pain: Secondary | ICD-10-CM

## 2013-11-06 MED ORDER — ESOMEPRAZOLE MAGNESIUM 40 MG PO CPDR: 40.0000 mg | DELAYED_RELEASE_CAPSULE | Freq: Every day | ORAL | Status: DC

## 2013-11-06 NOTE — Progress Notes (Signed)
   Subjective:   Patient ID: Kendra Wang, female    DOB: 03/31/1966, 48 y.o.   MRN: [id number]  Kendra Wang is a pleasant 48 y.o. year old female who presents to clinic today with Follow-up  on 11/06/2013  HPI: Martin Majestic to ER on 10/11/13.  Note reviewed.  So went to [facility] on 4/25- epigastric pain, aching, nausea for a few weeks.  Was sent to ER.  [place] notes reviewed.   EKG, tropinin, lipase, CMET, CBC all unremarkable.  Was told it was gastritis and sent home on Nexium 40 mg qam.  Symptoms have resolved since a week after her visit. Has had no symptoms in last couple of weeks. Non smoker, does not drink much alcohol. Does take NSAIDs for headaches, pain.  Recently given Rx for ibuprofen because she had a tooth pulled.  Does like spicy foods but cutting back.  Does not drink coffee but does drink coke.  No bloody or black stools.  Neg endoscopy 04/05/2010 (Dr. Candace Cruise- scanned in [place]).  Has had increased stressors over past several years - death of her mother, caretaker for her father.  Has let her health go.  She is very concerned she may have bile duct CA as that is what her mother died from.  Wt Readings from Last 3 Encounters:  11/06/13 231 lb 12 oz (105.121 kg)  08/31/11 228 lb (103.42 kg)  08/18/10 241 lb 12 oz (109.657 kg)        Review of Systems See HPI +belching Appetite good Denies anxiety or depression Denies LE edema    Objective:    BP 132/88  Pulse 76  Temp(Src) 98.2 F (36.8 C) (Oral)  Ht 5\' 1"  (1.549 m)  Wt 231 lb 12 oz (105.121 kg)  BMI 43.81 kg/m2  SpO2 96%   Physical Exam  Gen:  Alert, pleasant, obese, NAD HEENT:  MMM Abd:  Soft, NT, +BS Ext:  No edema      Assessment & Plan:   Abdominal pain, epigastric No Follow-up on file.

## 2013-11-06 NOTE — Assessment & Plan Note (Signed)
Most consistent with GERD/PUD. Continue Nexium, DC NSAIDs Check for H.pylori. [place] of abdomen for pt reassurance. Call or return to clinic prn if these symptoms worsen or fail to improve as anticipated. The patient indicates understanding of these issues and agrees with the plan.

## 2013-11-06 NOTE — Progress Notes (Signed)
Pre visit review using our clinic review tool, if applicable. No additional management support is needed unless otherwise documented below in the visit note. 

## 2013-11-06 NOTE — Telephone Encounter (Signed)
Relevant patient education mailed to patient.  

## 2013-11-06 NOTE — Patient Instructions (Signed)
Wonderful to see you. Please do not take any motrin, ibuprofen, alleve, Good powders.  Please stop by the lab and then see Rosaria Ferries on your way out to set up your ultrasound.

## 2013-11-07 ENCOUNTER — Ambulatory Visit: Payer: Self-pay | Admitting: Family Medicine

## 2013-11-11 ENCOUNTER — Encounter: Payer: Self-pay | Admitting: Family Medicine

## 2013-11-13 ENCOUNTER — Other Ambulatory Visit: Payer: Self-pay | Admitting: Family Medicine

## 2013-11-14 LAB — HELICOBACTER PYLORI  SPECIAL ANTIGEN: H. PYLORI ANTIGEN STOOL: NEGATIVE

## 2014-01-08 ENCOUNTER — Other Ambulatory Visit: Payer: Self-pay | Admitting: Family Medicine

## 2014-02-04 ENCOUNTER — Encounter: Payer: BC Managed Care – PPO | Admitting: Family Medicine

## 2014-09-01 ENCOUNTER — Encounter: Payer: Self-pay | Admitting: Family Medicine

## 2014-09-02 ENCOUNTER — Encounter: Payer: Self-pay | Admitting: Family Medicine

## 2014-09-02 ENCOUNTER — Ambulatory Visit (INDEPENDENT_AMBULATORY_CARE_PROVIDER_SITE_OTHER): Payer: BLUE CROSS/BLUE SHIELD | Admitting: Family Medicine

## 2014-09-02 VITALS — BP 134/62 | HR 90 | Temp 98.2°F | Ht 61.0 in | Wt 242.8 lb

## 2014-09-02 DIAGNOSIS — E669 Obesity, unspecified: Secondary | ICD-10-CM

## 2014-09-02 DIAGNOSIS — Z01419 Encounter for gynecological examination (general) (routine) without abnormal findings: Secondary | ICD-10-CM

## 2014-09-02 DIAGNOSIS — Z1239 Encounter for other screening for malignant neoplasm of breast: Secondary | ICD-10-CM

## 2014-09-02 DIAGNOSIS — Z0000 Encounter for general adult medical examination without abnormal findings: Secondary | ICD-10-CM

## 2014-09-02 LAB — CBC WITH DIFFERENTIAL/PLATELET
BASOS ABS: 0 10*3/uL (ref 0.0–0.1)
Basophils Relative: 0.6 % (ref 0.0–3.0)
EOS ABS: 0.1 10*3/uL (ref 0.0–0.7)
Eosinophils Relative: 2.2 % (ref 0.0–5.0)
HCT: 37.7 % (ref 36.0–46.0)
HEMOGLOBIN: 12.3 g/dL (ref 12.0–15.0)
LYMPHS ABS: 3.2 10*3/uL (ref 0.7–4.0)
Lymphocytes Relative: 55.4 % — ABNORMAL HIGH (ref 12.0–46.0)
MCHC: 32.5 g/dL (ref 30.0–36.0)
MCV: 81.3 fl (ref 78.0–100.0)
Monocytes Absolute: 0.6 10*3/uL (ref 0.1–1.0)
Monocytes Relative: 10.6 % (ref 3.0–12.0)
Neutro Abs: 1.7 10*3/uL (ref 1.4–7.7)
Neutrophils Relative %: 30.8 % — ABNORMAL LOW (ref 43.0–77.0)
PLATELETS: 384 10*3/uL (ref 150.0–400.0)
RBC: 4.63 Mil/uL (ref 3.87–5.11)
RDW: 18.4 % — AB (ref 11.5–15.5)
WBC: 5.7 10*3/uL (ref 4.0–10.5)

## 2014-09-02 LAB — LIPID PANEL
Cholesterol: 254 mg/dL — ABNORMAL HIGH (ref 0–200)
HDL: 48.9 mg/dL (ref >39.00)
NonHDL: 205.1
TRIGLYCERIDES: 210 mg/dL — AB (ref 0.0–149.0)
Total CHOL/HDL Ratio: 5
VLDL: 42 mg/dL — ABNORMAL HIGH (ref 0.0–40.0)

## 2014-09-02 LAB — TSH: TSH: 1.92 u[IU]/mL (ref 0.35–4.50)

## 2014-09-02 LAB — COMPREHENSIVE METABOLIC PANEL
ALBUMIN: 4.2 g/dL (ref 3.5–5.2)
ALK PHOS: 108 U/L (ref 39–117)
ALT: 14 U/L (ref 0–35)
AST: 19 U/L (ref 0–37)
BILIRUBIN TOTAL: 0.3 mg/dL (ref 0.2–1.2)
BUN: 7 mg/dL (ref 6–23)
CHLORIDE: 103 meq/L (ref 96–112)
CO2: 32 mEq/L (ref 19–32)
Calcium: 9.4 mg/dL (ref 8.4–10.5)
Creatinine, Ser: 0.74 mg/dL (ref 0.40–1.20)
GFR: 107.2 mL/min (ref >60.00)
Glucose, Bld: 86 mg/dL (ref 70–99)
POTASSIUM: 4.1 meq/L (ref 3.5–5.1)
SODIUM: 138 meq/L (ref 135–145)
TOTAL PROTEIN: 7.2 g/dL (ref 6.0–8.3)

## 2014-09-02 LAB — LDL CHOLESTEROL, DIRECT: Direct LDL: 149 mg/dL

## 2014-09-02 MED ORDER — ESOMEPRAZOLE MAGNESIUM 40 MG PO CPDR: DELAYED_RELEASE_CAPSULE | ORAL | Status: DC

## 2014-09-02 NOTE — Assessment & Plan Note (Signed)
Reviewed preventive care protocols, scheduled due services, and updated immunizations Discussed nutrition, exercise, diet, and healthy lifestyle.  Flu vaccine refused.  Mammogram ordered.  Orders Placed This Encounter  Procedures  . MM Digital Screening  . CBC with Differential/Platelet  . Comprehensive metabolic panel  . Lipid panel  . TSH

## 2014-09-02 NOTE — Patient Instructions (Signed)
Good to see you. We will call you with your lab results.  Please call to schedule your mammogram.

## 2014-09-02 NOTE — Assessment & Plan Note (Signed)
Deteriorated. BMI > 45. Discussed risks of phentermine including pulmonary HTN, HTN, stroke and she does not want to restart this.  Not interested in nutritionist referral at this time. She plans on increasing physical activity and decreasing portions. If no improvement- ?bariatric surgery- will address this at next OV.

## 2014-09-02 NOTE — Progress Notes (Signed)
Pre visit review using our clinic review tool, if applicable. No additional management support is needed unless otherwise documented below in the visit note. 
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
## 2014-11-18 ENCOUNTER — Telehealth: Payer: Self-pay

## 2014-11-18 DIAGNOSIS — R109 Unspecified abdominal pain: Secondary | ICD-10-CM

## 2014-11-18 NOTE — Telephone Encounter (Signed)
We unfortunately do not call in antibiotics without and evaluation. Ultrasound ordered.

## 2014-11-18 NOTE — Telephone Encounter (Signed)
Spoke to pt and advised per Dr Deborra Medina. Pt advised to await a call with appt details;verbally expressed understanding.

## 2014-11-18 NOTE — Telephone Encounter (Signed)
Pt was seen 09/02/2014 for CPX; pt continues with intermittent soreness and burning sensation under both breast;no diarrhea or N&V now. Pt request [place] for continuing stomach issue; pt also having head and chest congestion; when blows nose mucus is primarily clear but prod cough has green phlegm; slight wheezing at times; no fever or SOB. Pt request abx to [facility]. Pt said could not make appt at this time due to work conflict. Please advise. Pt request cb.

## 2014-11-25 ENCOUNTER — Ambulatory Visit (INDEPENDENT_AMBULATORY_CARE_PROVIDER_SITE_OTHER): Payer: BLUE CROSS/BLUE SHIELD | Admitting: Family Medicine

## 2014-11-25 ENCOUNTER — Encounter: Payer: Self-pay | Admitting: Family Medicine

## 2014-11-25 VITALS — BP 120/72 | HR 85 | Temp 98.5°F | Wt 248.8 lb

## 2014-11-25 DIAGNOSIS — J0110 Acute frontal sinusitis, unspecified: Secondary | ICD-10-CM | POA: Diagnosis not present

## 2014-11-25 MED ORDER — AMOXICILLIN-POT CLAVULANATE 875-125 MG PO TABS: 1.0000 | ORAL_TABLET | Freq: Two times a day (BID) | ORAL | Status: AC

## 2014-11-25 MED ORDER — HYDROCOD POLST-CPM POLST ER 10-8 MG/5ML PO SUER: 5.0000 mL | Freq: Every evening | ORAL | Status: DC | PRN

## 2014-11-25 NOTE — Progress Notes (Signed)
Pre visit review using our clinic review tool, if applicable. No additional management support is needed unless otherwise documented below in the visit note. 
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
## 2014-11-25 NOTE — Patient Instructions (Signed)
Good to see you. Please start either claritin or Allegra (over the counter).  Drink a lot of fluids. Take Augmentin as directed- 1 tablet twice daily for 10 days.  Tussionex as needed at bedtime for cough.

## 2014-11-26 ENCOUNTER — Ambulatory Visit
Admission: RE | Admit: 2014-11-26 | Discharge: 2014-11-26 | Disposition: A | Discharge status: Home / Self Care | Payer: BLUE CROSS/BLUE SHIELD | Source: Ambulatory Visit | Attending: Family Medicine | Admitting: Family Medicine

## 2014-11-26 DIAGNOSIS — R1011 Right upper quadrant pain: Secondary | ICD-10-CM | POA: Diagnosis present

## 2014-11-26 DIAGNOSIS — R109 Unspecified abdominal pain: Secondary | ICD-10-CM

## 2015-07-12 ENCOUNTER — Ambulatory Visit: Payer: BLUE CROSS/BLUE SHIELD | Admitting: Physical Therapy

## 2015-07-12 ENCOUNTER — Ambulatory Visit: Payer: BLUE CROSS/BLUE SHIELD | Attending: Family Medicine | Admitting: Physical Therapy

## 2015-07-12 DIAGNOSIS — R293 Abnormal posture: Secondary | ICD-10-CM | POA: Insufficient documentation

## 2015-07-12 DIAGNOSIS — R6889 Other general symptoms and signs: Secondary | ICD-10-CM | POA: Diagnosis present

## 2015-07-12 DIAGNOSIS — M25551 Pain in right hip: Secondary | ICD-10-CM | POA: Diagnosis present

## 2015-07-12 DIAGNOSIS — M62838 Other muscle spasm: Secondary | ICD-10-CM | POA: Diagnosis present

## 2015-07-12 DIAGNOSIS — M5441 Lumbago with sciatica, right side: Secondary | ICD-10-CM | POA: Insufficient documentation

## 2015-07-12 NOTE — Patient Instructions (Signed)
   Kion Huntsberry PT, DPT, LAT, ATC  [facility] Phone: [phone]     
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
## 2015-07-13 ENCOUNTER — Ambulatory Visit: Payer: BLUE CROSS/BLUE SHIELD | Admitting: Physical Therapy

## 2015-07-20 ENCOUNTER — Ambulatory Visit: Payer: BLUE CROSS/BLUE SHIELD | Admitting: Physical Therapy

## 2015-07-27 ENCOUNTER — Encounter: Payer: BLUE CROSS/BLUE SHIELD | Admitting: Physical Therapy

## 2015-08-03 ENCOUNTER — Encounter: Payer: BLUE CROSS/BLUE SHIELD | Admitting: Physical Therapy

## 2015-08-09 ENCOUNTER — Emergency Department
Admission: EM | Admit: 2015-08-09 | Discharge: 2015-08-09 | Disposition: A | Discharge status: Home / Self Care | Payer: BLUE CROSS/BLUE SHIELD | Attending: Emergency Medicine | Admitting: Emergency Medicine

## 2015-08-09 DIAGNOSIS — L03012 Cellulitis of left finger: Secondary | ICD-10-CM | POA: Diagnosis not present

## 2015-08-09 DIAGNOSIS — F17200 Nicotine dependence, unspecified, uncomplicated: Secondary | ICD-10-CM | POA: Insufficient documentation

## 2015-08-09 DIAGNOSIS — Z79899 Other long term (current) drug therapy: Secondary | ICD-10-CM | POA: Insufficient documentation

## 2015-08-09 DIAGNOSIS — L539 Erythematous condition, unspecified: Secondary | ICD-10-CM | POA: Diagnosis present

## 2015-08-09 MED ORDER — TRAMADOL HCL 50 MG PO TABS: 50.0000 mg | ORAL_TABLET | Freq: Four times a day (QID) | ORAL | Status: DC | PRN

## 2015-08-09 MED ORDER — CEPHALEXIN 500 MG PO CAPS: 500.0000 mg | ORAL_CAPSULE | Freq: Four times a day (QID) | ORAL | Status: DC

## 2015-08-09 NOTE — ED Notes (Signed)
Swelling and erythema around nailbed of left thumb.  No visible drainage.

## 2015-08-09 NOTE — Discharge Instructions (Signed)
Paronychia °Paronychia is an infection of the skin that surrounds a nail. It usually affects the skin around a fingernail, but it may also occur near a toenail. It often causes pain and swelling around the nail. This condition may come on suddenly or develop over a longer period. In some cases, a collection of pus (abscess) can form near or under the nail. Usually, paronychia is not serious and it clears up with treatment. °CAUSES °This condition may be caused by bacteria or fungi. It is commonly caused by either Streptococcus or Staphylococcus bacteria. The bacteria or fungi often cause the infection by getting into the affected area through an opening in the skin, such as a cut or a hangnail. °RISK FACTORS °This condition is more likely to develop in: °· People who get their hands wet often, such as those who work as dishwashers, bartenders, or nurses. °· People who bite their fingernails or suck their thumbs. °· People who trim their nails too short. °· People who have hangnails or injured fingertips. °· People who get manicures. °· People who have diabetes. °SYMPTOMS °Symptoms of this condition include: °· Redness and swelling of the skin near the nail. °· Tenderness around the nail when you touch the area. °· Pus-filled bumps under the cuticle. The cuticle is the skin at the base or sides of the nail. °· Fluid or pus under the nail. °· Throbbing pain in the area. °DIAGNOSIS °This condition is usually diagnosed with a physical exam. In some cases, a sample of pus may be taken from an abscess to be tested in a lab. This can help to determine what type of bacteria or fungi is causing the condition. °TREATMENT °Treatment for this condition depends on the cause and severity of the condition. If the condition is mild, it may clear up on its own in a few days. Your health care provider may recommend soaking the affected area in warm water a few times a day. When treatment is needed, the options may  include: °· Antibiotic medicine, if the condition is caused by a bacterial infection. °· Antifungal medicine, if the condition is caused by a fungal infection. °· Incision and drainage, if an abscess is present. In this procedure, the health care provider will cut open the abscess so the pus can drain out. °HOME CARE INSTRUCTIONS °· Soak the affected area in warm water if directed to do so by your health care provider. You may be told to do this for 20 minutes, 2-3 times a day. Keep the area dry in between soakings. °· Take medicines only as directed by your health care provider. °· If you were prescribed an antibiotic medicine, finish all of it even if you start to feel better. °· Keep the affected area clean. °· Do not try to drain a fluid-filled bump yourself. °· If you will be washing dishes or performing other tasks that require your hands to get wet, wear rubber gloves. You should also wear gloves if your hands might come in contact with irritating substances, such as cleaners or chemicals. °· Follow your health care provider's instructions about: °¨ Wound care. °¨ Bandage (dressing) changes and removal. °SEEK MEDICAL CARE IF: °· Your symptoms get worse or do not improve with treatment. °· You have a fever or chills. °· You have redness spreading from the affected area. °· You have continued or increased fluid, blood, or pus coming from the affected area. °· Your finger or knuckle becomes swollen or is difficult to move. °  °  This information is not intended to replace advice given to you by your health care provider. Make sure you discuss any questions you have with your health care provider. °  °Document Released: 11/29/2000 Document Revised: 10/20/2014 Document Reviewed: 05/13/2014 °Elsevier Interactive Patient Education ©2016 [employer] ° °

## 2015-08-09 NOTE — ED Notes (Signed)
Patient ambulatory to triage with steady gait, without difficulty or distress noted; pt reports thumb thumb pain/swelling x week; denies any known injury but st "I might have bit a cuticle"
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
## 2015-08-10 ENCOUNTER — Encounter: Payer: BLUE CROSS/BLUE SHIELD | Admitting: Physical Therapy

## 2015-08-17 ENCOUNTER — Encounter: Payer: BLUE CROSS/BLUE SHIELD | Admitting: Physical Therapy

## 2015-08-26 ENCOUNTER — Other Ambulatory Visit: Payer: Self-pay | Admitting: Family Medicine

## 2015-08-26 DIAGNOSIS — E559 Vitamin D deficiency, unspecified: Secondary | ICD-10-CM

## 2015-08-26 DIAGNOSIS — Z01419 Encounter for gynecological examination (general) (routine) without abnormal findings: Secondary | ICD-10-CM | POA: Insufficient documentation

## 2015-08-31 ENCOUNTER — Other Ambulatory Visit (INDEPENDENT_AMBULATORY_CARE_PROVIDER_SITE_OTHER): Payer: BLUE CROSS/BLUE SHIELD

## 2015-08-31 DIAGNOSIS — R7989 Other specified abnormal findings of blood chemistry: Secondary | ICD-10-CM | POA: Diagnosis not present

## 2015-08-31 DIAGNOSIS — Z0000 Encounter for general adult medical examination without abnormal findings: Secondary | ICD-10-CM | POA: Diagnosis not present

## 2015-08-31 DIAGNOSIS — E559 Vitamin D deficiency, unspecified: Secondary | ICD-10-CM

## 2015-08-31 DIAGNOSIS — Z01419 Encounter for gynecological examination (general) (routine) without abnormal findings: Secondary | ICD-10-CM

## 2015-08-31 LAB — TSH: TSH: 1.59 u[IU]/mL (ref 0.35–4.50)

## 2015-08-31 LAB — CBC WITH DIFFERENTIAL/PLATELET
BASOS ABS: 0 10*3/uL (ref 0.0–0.1)
Basophils Relative: 0.5 % (ref 0.0–3.0)
EOS PCT: 1.4 % (ref 0.0–5.0)
Eosinophils Absolute: 0.1 10*3/uL (ref 0.0–0.7)
HCT: 41.5 % (ref 36.0–46.0)
HEMOGLOBIN: 13.7 g/dL (ref 12.0–15.0)
LYMPHS ABS: 3.5 10*3/uL (ref 0.7–4.0)
Lymphocytes Relative: 46.9 % — ABNORMAL HIGH (ref 12.0–46.0)
MCHC: 32.9 g/dL (ref 30.0–36.0)
MCV: 89.3 fl (ref 78.0–100.0)
MONO ABS: 0.6 10*3/uL (ref 0.1–1.0)
Monocytes Relative: 8.2 % (ref 3.0–12.0)
NEUTROS PCT: 43 % (ref 43.0–77.0)
Neutro Abs: 3.2 10*3/uL (ref 1.4–7.7)
Platelets: 333 10*3/uL (ref 150.0–400.0)
RBC: 4.65 Mil/uL (ref 3.87–5.11)
RDW: 14.8 % (ref 11.5–15.5)
WBC: 7.4 10*3/uL (ref 4.0–10.5)

## 2015-08-31 LAB — LIPID PANEL
CHOLESTEROL: 192 mg/dL (ref 0–200)
HDL: 46.7 mg/dL (ref >39.00)
NonHDL: 145.29
Total CHOL/HDL Ratio: 4
Triglycerides: 209 mg/dL — ABNORMAL HIGH (ref 0.0–149.0)
VLDL: 41.8 mg/dL — ABNORMAL HIGH (ref 0.0–40.0)

## 2015-08-31 LAB — COMPREHENSIVE METABOLIC PANEL
ALBUMIN: 3.9 g/dL (ref 3.5–5.2)
ALK PHOS: 70 U/L (ref 39–117)
ALT: 8 U/L (ref 0–35)
AST: 12 U/L (ref 0–37)
BILIRUBIN TOTAL: 0.3 mg/dL (ref 0.2–1.2)
BUN: 8 mg/dL (ref 6–23)
CO2: 29 mEq/L (ref 19–32)
CREATININE: 0.8 mg/dL (ref 0.40–1.20)
Calcium: 9.4 mg/dL (ref 8.4–10.5)
Chloride: 103 mEq/L (ref 96–112)
GFR: 97.58 mL/min (ref >60.00)
GLUCOSE: 95 mg/dL (ref 70–99)
Potassium: 4 mEq/L (ref 3.5–5.1)
SODIUM: 142 meq/L (ref 135–145)
TOTAL PROTEIN: 6.7 g/dL (ref 6.0–8.3)

## 2015-08-31 LAB — LDL CHOLESTEROL, DIRECT: Direct LDL: 92 mg/dL

## 2015-08-31 LAB — VITAMIN D 25 HYDROXY (VIT D DEFICIENCY, FRACTURES): VITD: 12.99 ng/mL — AB (ref 30.00–100.00)

## 2015-09-07 ENCOUNTER — Encounter: Payer: Self-pay | Admitting: Family Medicine

## 2015-09-07 ENCOUNTER — Ambulatory Visit (INDEPENDENT_AMBULATORY_CARE_PROVIDER_SITE_OTHER): Payer: BLUE CROSS/BLUE SHIELD | Admitting: Family Medicine

## 2015-09-07 VITALS — BP 132/74 | HR 83 | Temp 97.9°F | Ht 60.75 in | Wt 237.2 lb

## 2015-09-07 DIAGNOSIS — M545 Low back pain: Secondary | ICD-10-CM

## 2015-09-07 DIAGNOSIS — M5430 Sciatica, unspecified side: Secondary | ICD-10-CM

## 2015-09-07 DIAGNOSIS — M5440 Lumbago with sciatica, unspecified side: Secondary | ICD-10-CM | POA: Insufficient documentation

## 2015-09-07 DIAGNOSIS — Z0000 Encounter for general adult medical examination without abnormal findings: Secondary | ICD-10-CM

## 2015-09-07 DIAGNOSIS — E669 Obesity, unspecified: Secondary | ICD-10-CM | POA: Diagnosis not present

## 2015-09-07 DIAGNOSIS — K219 Gastro-esophageal reflux disease without esophagitis: Secondary | ICD-10-CM

## 2015-09-07 DIAGNOSIS — Z9189 Other specified personal risk factors, not elsewhere classified: Secondary | ICD-10-CM

## 2015-09-07 DIAGNOSIS — Z01419 Encounter for gynecological examination (general) (routine) without abnormal findings: Secondary | ICD-10-CM

## 2015-09-07 DIAGNOSIS — Z1211 Encounter for screening for malignant neoplasm of colon: Secondary | ICD-10-CM

## 2015-09-07 DIAGNOSIS — E559 Vitamin D deficiency, unspecified: Secondary | ICD-10-CM

## 2015-09-07 MED ORDER — PHENTERMINE HCL 15 MG PO CAPS: 15.0000 mg | ORAL_CAPSULE | ORAL | Status: DC

## 2015-09-07 MED ORDER — VITAMIN D (ERGOCALCIFEROL) 1.25 MG (50000 UNIT) PO CAPS: 50000.0000 [IU] | ORAL_CAPSULE | ORAL | Status: DC

## 2015-09-07 NOTE — Progress Notes (Signed)
Pre visit review using our clinic review tool, if applicable. No additional management support is needed unless otherwise documented below in the visit note. 

## 2015-09-07 NOTE — Assessment & Plan Note (Signed)
Deteriorated. eRx sent for high dose weekly vitamin D. Recheck Vit D in 12 weeks. The patient indicates understanding of these issues and agrees with the plan.

## 2015-09-07 NOTE — Assessment & Plan Note (Signed)
Reviewed preventive care protocols, scheduled due services, and updated immunizations Discussed nutrition, exercise, diet, and healthy lifestyle.  Referral made for colonoscopy. Pt to call to schedule mammogram.

## 2015-09-07 NOTE — Assessment & Plan Note (Signed)
Discussed weight loss plan.  Pt would also like to discuss medication options- discussed phentermine risk benefits, side effects including HTN, pulmonary HTN, stroke.    She would like to start phentermine and lifestyle changes.  Follow up in 1 month.  If BMI < 27 will decrease to half dose x 1 month then stop   

## 2015-09-07 NOTE — Patient Instructions (Addendum)
  Great to see you. We are starting phentermine 15 mg daily.  Please come see me in 1 month. Also please schedule your mammogram.  Please stop by to see Rosaria Ferries on your way out.
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
## 2015-09-21 ENCOUNTER — Ambulatory Visit
Admission: RE | Admit: 2015-09-21 | Discharge: 2015-09-21 | Disposition: A | Discharge status: Home / Self Care | Payer: BLUE CROSS/BLUE SHIELD | Source: Ambulatory Visit | Attending: Family Medicine | Admitting: Family Medicine

## 2015-09-21 DIAGNOSIS — Z1231 Encounter for screening mammogram for malignant neoplasm of breast: Secondary | ICD-10-CM | POA: Insufficient documentation

## 2015-09-21 DIAGNOSIS — Z1239 Encounter for other screening for malignant neoplasm of breast: Secondary | ICD-10-CM | POA: Diagnosis not present

## 2015-10-07 ENCOUNTER — Ambulatory Visit: Payer: BLUE CROSS/BLUE SHIELD | Admitting: Family Medicine

## 2015-11-10 ENCOUNTER — Other Ambulatory Visit: Payer: Self-pay | Admitting: Physician Assistant

## 2015-11-10 DIAGNOSIS — M545 Low back pain: Secondary | ICD-10-CM

## 2015-11-10 DIAGNOSIS — M5459 Other low back pain: Secondary | ICD-10-CM

## 2015-11-24 ENCOUNTER — Other Ambulatory Visit: Payer: Self-pay | Admitting: Obstetrics and Gynecology

## 2015-11-24 DIAGNOSIS — Z13820 Encounter for screening for osteoporosis: Secondary | ICD-10-CM

## 2015-12-08 ENCOUNTER — Ambulatory Visit: Payer: BLUE CROSS/BLUE SHIELD

## 2016-09-18 ENCOUNTER — Other Ambulatory Visit: Payer: Self-pay | Admitting: Family Medicine

## 2016-09-18 ENCOUNTER — Other Ambulatory Visit (INDEPENDENT_AMBULATORY_CARE_PROVIDER_SITE_OTHER): Payer: BLUE CROSS/BLUE SHIELD

## 2016-09-18 DIAGNOSIS — Z01419 Encounter for gynecological examination (general) (routine) without abnormal findings: Secondary | ICD-10-CM

## 2016-09-18 DIAGNOSIS — E559 Vitamin D deficiency, unspecified: Secondary | ICD-10-CM

## 2016-09-18 LAB — CBC WITH DIFFERENTIAL/PLATELET
BASOS ABS: 0.1 10*3/uL (ref 0.0–0.1)
Basophils Relative: 1.3 % (ref 0.0–3.0)
EOS ABS: 0.1 10*3/uL (ref 0.0–0.7)
Eosinophils Relative: 2.1 % (ref 0.0–5.0)
HCT: 42.6 % (ref 36.0–46.0)
Hemoglobin: 14.2 g/dL (ref 12.0–15.0)
Lymphocytes Relative: 53.8 % — ABNORMAL HIGH (ref 12.0–46.0)
Lymphs Abs: 3.3 10*3/uL (ref 0.7–4.0)
MCHC: 33.3 g/dL (ref 30.0–36.0)
MCV: 90 fl (ref 78.0–100.0)
MONO ABS: 0.6 10*3/uL (ref 0.1–1.0)
Monocytes Relative: 9.6 % (ref 3.0–12.0)
Neutro Abs: 2.1 10*3/uL (ref 1.4–7.7)
Neutrophils Relative %: 33.2 % — ABNORMAL LOW (ref 43.0–77.0)
Platelets: 348 10*3/uL (ref 150.0–400.0)
RBC: 4.73 Mil/uL (ref 3.87–5.11)
RDW: 14 % (ref 11.5–15.5)
WBC: 6.2 10*3/uL (ref 4.0–10.5)

## 2016-09-18 LAB — COMPREHENSIVE METABOLIC PANEL
ALBUMIN: 4.2 g/dL (ref 3.5–5.2)
ALK PHOS: 94 U/L (ref 39–117)
ALT: 15 U/L (ref 0–35)
AST: 18 U/L (ref 0–37)
BUN: 11 mg/dL (ref 6–23)
CO2: 30 mEq/L (ref 19–32)
CREATININE: 0.86 mg/dL (ref 0.40–1.20)
Calcium: 9.5 mg/dL (ref 8.4–10.5)
Chloride: 106 mEq/L (ref 96–112)
GFR: 89.39 mL/min (ref >60.00)
GLUCOSE: 93 mg/dL (ref 70–99)
Potassium: 4.1 mEq/L (ref 3.5–5.1)
SODIUM: 141 meq/L (ref 135–145)
TOTAL PROTEIN: 7.2 g/dL (ref 6.0–8.3)
Total Bilirubin: 0.3 mg/dL (ref 0.2–1.2)

## 2016-09-18 LAB — TSH: TSH: 2.17 u[IU]/mL (ref 0.35–4.50)

## 2016-09-18 LAB — LIPID PANEL
Cholesterol: 237 mg/dL — ABNORMAL HIGH (ref 0–200)
HDL: 53.7 mg/dL (ref >39.00)
LDL Cholesterol: 155 mg/dL — ABNORMAL HIGH (ref 0–99)
NONHDL: 183.47
Total CHOL/HDL Ratio: 4
Triglycerides: 141 mg/dL (ref 0.0–149.0)
VLDL: 28.2 mg/dL (ref 0.0–40.0)

## 2016-09-18 LAB — VITAMIN D 25 HYDROXY (VIT D DEFICIENCY, FRACTURES): VITD: 20.58 ng/mL — AB (ref 30.00–100.00)

## 2016-09-19 ENCOUNTER — Encounter (INDEPENDENT_AMBULATORY_CARE_PROVIDER_SITE_OTHER): Payer: Self-pay

## 2016-09-19 ENCOUNTER — Ambulatory Visit (INDEPENDENT_AMBULATORY_CARE_PROVIDER_SITE_OTHER): Payer: BLUE CROSS/BLUE SHIELD | Admitting: Family Medicine

## 2016-09-19 VITALS — BP 128/78 | HR 78 | Ht 61.0 in | Wt 240.8 lb

## 2016-09-19 DIAGNOSIS — Z0000 Encounter for general adult medical examination without abnormal findings: Secondary | ICD-10-CM | POA: Diagnosis not present

## 2016-09-19 DIAGNOSIS — Z1211 Encounter for screening for malignant neoplasm of colon: Secondary | ICD-10-CM

## 2016-09-19 DIAGNOSIS — E559 Vitamin D deficiency, unspecified: Secondary | ICD-10-CM

## 2016-09-19 DIAGNOSIS — Z01419 Encounter for gynecological examination (general) (routine) without abnormal findings: Secondary | ICD-10-CM

## 2016-09-19 DIAGNOSIS — E785 Hyperlipidemia, unspecified: Secondary | ICD-10-CM | POA: Diagnosis not present

## 2016-09-19 NOTE — Assessment & Plan Note (Signed)
Reviewed preventive care protocols, scheduled due services, and updated immunizations Discussed nutrition, exercise, diet, and healthy lifestyle.  

## 2016-09-19 NOTE — Assessment & Plan Note (Signed)
Deteriorated. Advised restart OTC vit D- 2000 IU daily.
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
## 2016-09-19 NOTE — Patient Instructions (Addendum)
Great to see you.  Let's work on your low cholesterol diet.  We will call you about a colonoscopy.  Please call [place] to schedule your mammogram.  Restart OTC vitamin D- 2000 IU daily.

## 2016-09-19 NOTE — Assessment & Plan Note (Signed)
Deteriorated. She would like to work on diet before starting a statin. Low cholesterol diet handout given to pt.

## 2016-11-07 ENCOUNTER — Telehealth: Payer: Self-pay

## 2016-11-07 DIAGNOSIS — F4321 Adjustment disorder with depressed mood: Secondary | ICD-10-CM

## 2016-11-07 DIAGNOSIS — M545 Low back pain: Secondary | ICD-10-CM

## 2016-11-07 NOTE — Telephone Encounter (Signed)
Referrals placed 

## 2016-11-07 NOTE — Telephone Encounter (Signed)
Pt left v/m requesting referral to psychiatrist in [place] due to anxiety and dealing with father's recent death. No SI/HI. Last annual on 09/19/16. Pt also thinks orthopedic dr she is seeing is too busy to take care of pts needs and wants to see different orthopedic in [facility] besides [place] and [place]; if no other ortho in [place] pt will go to [facility].

## 2016-11-28 ENCOUNTER — Other Ambulatory Visit: Payer: Self-pay | Admitting: Obstetrics and Gynecology

## 2016-11-28 DIAGNOSIS — Z13820 Encounter for screening for osteoporosis: Secondary | ICD-10-CM

## 2016-11-28 DIAGNOSIS — Z1231 Encounter for screening mammogram for malignant neoplasm of breast: Secondary | ICD-10-CM

## 2017-01-04 ENCOUNTER — Emergency Department
Admission: EM | Admit: 2017-01-04 | Discharge: 2017-01-05 | Disposition: A | Discharge status: Home / Self Care | Payer: BLUE CROSS/BLUE SHIELD | Attending: Emergency Medicine | Admitting: Emergency Medicine

## 2017-01-04 ENCOUNTER — Emergency Department: Payer: BLUE CROSS/BLUE SHIELD

## 2017-01-04 ENCOUNTER — Encounter: Payer: Self-pay | Admitting: *Deleted

## 2017-01-04 DIAGNOSIS — Z7982 Long term (current) use of aspirin: Secondary | ICD-10-CM | POA: Diagnosis not present

## 2017-01-04 DIAGNOSIS — Z79899 Other long term (current) drug therapy: Secondary | ICD-10-CM | POA: Diagnosis not present

## 2017-01-04 DIAGNOSIS — M79604 Pain in right leg: Secondary | ICD-10-CM | POA: Insufficient documentation

## 2017-01-04 DIAGNOSIS — T148XXA Other injury of unspecified body region, initial encounter: Secondary | ICD-10-CM

## 2017-01-04 DIAGNOSIS — F17200 Nicotine dependence, unspecified, uncomplicated: Secondary | ICD-10-CM | POA: Diagnosis not present

## 2017-01-04 NOTE — ED Triage Notes (Signed)
Pt complains of right lower calf pain with swelling for the last month, pt denies chest pain or any other symptoms, pt had a negative ultrasound three weeks ago
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
## 2017-01-05 NOTE — Discharge Instructions (Signed)
There was no evidence of blood clot (DVT) on your ultrasound.  You have a lesion that appears to be a small area of bleeding within the tissue (a hematoma) that may be causing your pain and swelling.  These lesions tend to resolve on their own, but please read through the included information and follow up either with your primary care doctor or with Dr. Roland Rack or one of his orthopedics colleagues in about a week for follow-up to see if the lesion has resolved as per the radiologist's recommendations.  Return to the emergency department if you develop new or worsening symptoms that concern you.

## 2017-01-05 NOTE — ED Provider Notes (Signed)
Clinical Course as of Jan 06 20  Thu Jan 04, 2017  2319 Assuming care from Dr. Quentin Cornwall.  In short, Kendra Wang is a 51 y.o. female with a chief complaint of leg pain.  Refer to the original H&P for additional details.  The current plan of care is to follow up U/S.  Anticipate d/c home depending upon U/S results.   [CF]  Fri Jan 05, 2017  [place] radiology to inquire about the interpretation of the U/S.  Apparently it has been done since 11:00pm but has not crossed over in the system.  She is faxing me a copy of the report.  [CF]  0018 No evidence of DVT.  "Solid avascular abnormality" thought to be a hematoma.  Advised patient in written instructions to follow up in one week to see if it has resolved.  [CF]    Clinical Course User Index [CF] Hinda Kehr, MD      Hinda Kehr, MD 01/05/17 [phone]

## 2017-01-06 ENCOUNTER — Emergency Department: Payer: BLUE CROSS/BLUE SHIELD

## 2017-01-06 ENCOUNTER — Emergency Department
Admission: EM | Admit: 2017-01-06 | Discharge: 2017-01-07 | Disposition: A | Discharge status: Home / Self Care | Payer: BLUE CROSS/BLUE SHIELD | Attending: Emergency Medicine | Admitting: Emergency Medicine

## 2017-01-06 DIAGNOSIS — M79604 Pain in right leg: Secondary | ICD-10-CM | POA: Diagnosis not present

## 2017-01-06 DIAGNOSIS — Z7982 Long term (current) use of aspirin: Secondary | ICD-10-CM | POA: Diagnosis not present

## 2017-01-06 DIAGNOSIS — F17200 Nicotine dependence, unspecified, uncomplicated: Secondary | ICD-10-CM | POA: Insufficient documentation

## 2017-01-06 DIAGNOSIS — Z791 Long term (current) use of non-steroidal anti-inflammatories (NSAID): Secondary | ICD-10-CM | POA: Insufficient documentation

## 2017-01-06 DIAGNOSIS — Z79899 Other long term (current) drug therapy: Secondary | ICD-10-CM | POA: Insufficient documentation

## 2017-01-06 MED ORDER — KETOROLAC TROMETHAMINE 30 MG/ML IJ SOLN
30.0000 mg | Freq: Once | INTRAMUSCULAR | Status: AC
  Administered 2017-01-06: 30 mg via INTRAMUSCULAR

## 2017-01-06 MED ORDER — KETOROLAC TROMETHAMINE 30 MG/ML IJ SOLN
30.0000 mg | Freq: Once | INTRAMUSCULAR | Status: DC
  Filled 2017-01-06: qty 1

## 2017-01-06 MED ORDER — HYDROCODONE-ACETAMINOPHEN 5-325 MG PO TABS: 1.0000 | ORAL_TABLET | Freq: Three times a day (TID) | ORAL | 0 refills | Status: DC | PRN

## 2017-01-06 NOTE — ED Triage Notes (Signed)
Patient reports seen and diagnosed with hematoma to right lower leg on Thursday.  Reports continue pain and reports bruising is now visible.  Reports unable to see her orthopaedic MD until Thursday and states will note be able to work unless she receives something for the pain.

## 2017-01-06 NOTE — Discharge Instructions (Signed)
Continue to monitor the right leg. Keep scheduled medical appointments with her providers and follow-up regarding current symptoms. Take medications as prescribed.
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
## 2017-01-06 NOTE — ED Notes (Signed)
Patient transported to Ultrasound 

## 2017-01-10 ENCOUNTER — Encounter: Payer: Self-pay | Admitting: Family Medicine

## 2017-01-10 ENCOUNTER — Ambulatory Visit (INDEPENDENT_AMBULATORY_CARE_PROVIDER_SITE_OTHER): Payer: BLUE CROSS/BLUE SHIELD | Admitting: Family Medicine

## 2017-01-10 DIAGNOSIS — M79604 Pain in right leg: Secondary | ICD-10-CM

## 2017-01-10 MED ORDER — HYDROCHLOROTHIAZIDE 12.5 MG PO CAPS: 12.5000 mg | ORAL_CAPSULE | Freq: Every day | ORAL | 2 refills | Status: DC
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
## 2017-01-10 NOTE — Assessment & Plan Note (Signed)
Improving swelling and pain. Has appt with ortho tomorrow. Advised to keep it.

## 2017-01-10 NOTE — Patient Instructions (Signed)
We are starting HCTZ 12.5 mg daily.  Please come see me in 2 weeks.   DASH Eating Plan DASH stands for "Dietary Approaches to Stop Hypertension." The DASH eating plan is a healthy eating plan that has been shown to reduce high blood pressure (hypertension). It may also reduce your risk for type 2 diabetes, heart disease, and stroke. The DASH eating plan may also help with weight loss. What are tips for following this plan? General guidelines  Avoid eating more than 2,300 mg (milligrams) of salt (sodium) a day. If you have hypertension, you may need to reduce your sodium intake to 1,500 mg a day.  Limit alcohol intake to no more than 1 drink a day for nonpregnant women and 2 drinks a day for men. One drink equals 12 oz of beer, 5 oz of wine, or 1 oz of hard liquor.  Work with your health care provider to maintain a healthy body weight or to lose weight. Ask what an ideal weight is for you.  Get at least 30 minutes of exercise that causes your heart to beat faster (aerobic exercise) most days of the week. Activities may include walking, swimming, or biking.  Work with your health care provider or diet and nutrition specialist (dietitian) to adjust your eating plan to your individual calorie needs. Reading food labels  Check food labels for the amount of sodium per serving. Choose foods with less than 5 percent of the Daily Value of sodium. Generally, foods with less than 300 mg of sodium per serving fit into this eating plan.  To find whole grains, look for the word "whole" as the first word in the ingredient list. Shopping  Buy products labeled as "low-sodium" or "no salt added."  Buy fresh foods. Avoid canned foods and premade or frozen meals. Cooking  Avoid adding salt when cooking. Use salt-free seasonings or herbs instead of table salt or sea salt. Check with your health care provider or pharmacist before using salt substitutes.  Do not fry foods. Cook foods using healthy methods  such as baking, boiling, grilling, and broiling instead.  Cook with heart-healthy oils, such as olive, canola, soybean, or sunflower oil. Meal planning   Eat a balanced diet that includes: ? 5 or more servings of fruits and vegetables each day. At each meal, try to fill half of your plate with fruits and vegetables. ? Up to 6-8 servings of whole grains each day. ? Less than 6 oz of lean meat, poultry, or fish each day. A 3-oz serving of meat is about the same size as a deck of cards. One egg equals 1 oz. ? 2 servings of low-fat dairy each day. ? A serving of nuts, seeds, or beans 5 times each week. ? Heart-healthy fats. Healthy fats called Omega-3 fatty acids are found in foods such as flaxseeds and coldwater fish, like sardines, salmon, and mackerel.  Limit how much you eat of the following: ? Canned or prepackaged foods. ? Food that is high in trans fat, such as fried foods. ? Food that is high in saturated fat, such as fatty meat. ? Sweets, desserts, sugary drinks, and other foods with added sugar. ? Full-fat dairy products.  Do not salt foods before eating.  Try to eat at least 2 vegetarian meals each week.  Eat more home-cooked food and less restaurant, buffet, and fast food.  When eating at a restaurant, ask that your food be prepared with less salt or no salt, if possible. What foods  are recommended? The items listed may not be a complete list. Talk with your dietitian about what dietary choices are best for you. Grains Whole-grain or whole-wheat bread. Whole-grain or whole-wheat pasta. Brown rice. [place]. Bulgur. Whole-grain and low-sodium cereals. Pita bread. Low-fat, low-sodium crackers. Whole-wheat flour tortillas. Vegetables Fresh or frozen vegetables (raw, steamed, roasted, or grilled). Low-sodium or reduced-sodium tomato and vegetable juice. Low-sodium or reduced-sodium tomato sauce and tomato paste. Low-sodium or reduced-sodium canned vegetables. Fruits All  fresh, dried, or frozen fruit. Canned fruit in natural juice (without added sugar). Meat and other protein foods Skinless chicken or [place]. Ground chicken or [place]. Pork with fat trimmed off. Fish and seafood. Egg whites. Dried beans, peas, or lentils. Unsalted nuts, nut butters, and seeds. Unsalted canned beans. Lean cuts of beef with fat trimmed off. Low-sodium, lean deli meat. Dairy Low-fat (1%) or fat-free (skim) milk. Fat-free, low-fat, or reduced-fat cheeses. Nonfat, low-sodium ricotta or cottage cheese. Low-fat or nonfat yogurt. Low-fat, low-sodium cheese. Fats and oils Soft margarine without trans fats. Vegetable oil. Low-fat, reduced-fat, or light mayonnaise and salad dressings (reduced-sodium). Canola, safflower, olive, soybean, and sunflower oils. Avocado. Seasoning and other foods Herbs. Spices. Seasoning mixes without salt. Unsalted popcorn and pretzels. Fat-free sweets. What foods are not recommended? The items listed may not be a complete list. Talk with your dietitian about what dietary choices are best for you. Grains Baked goods made with fat, such as croissants, muffins, or some breads. Dry pasta or rice meal packs. Vegetables Creamed or fried vegetables. Vegetables in a cheese sauce. Regular canned vegetables (not low-sodium or reduced-sodium). Regular canned tomato sauce and paste (not low-sodium or reduced-sodium). Regular tomato and vegetable juice (not low-sodium or reduced-sodium). Angie Fava. Olives. Fruits Canned fruit in a light or heavy syrup. Fried fruit. Fruit in cream or butter sauce. Meat and other protein foods Fatty cuts of meat. Ribs. Fried meat. Berniece Salines. Sausage. Bologna and other processed lunch meats. Salami. Fatback. Hotdogs. Bratwurst. Salted nuts and seeds. Canned beans with added salt. Canned or smoked fish. Whole eggs or egg yolks. Chicken or [place] with skin. Dairy Whole or 2% milk, cream, and half-and-half. Whole or full-fat cream cheese. Whole-fat or  sweetened yogurt. Full-fat cheese. Nondairy creamers. Whipped toppings. Processed cheese and cheese spreads. Fats and oils Butter. Stick margarine. Lard. Shortening. Ghee. Bacon fat. Tropical oils, such as coconut, palm kernel, or palm oil. Seasoning and other foods Salted popcorn and pretzels. Onion salt, garlic salt, seasoned salt, table salt, and sea salt. Worcestershire sauce. Tartar sauce. Barbecue sauce. Teriyaki sauce. Soy sauce, including reduced-sodium. Steak sauce. Canned and packaged gravies. Fish sauce. Oyster sauce. Cocktail sauce. Horseradish that you find on the shelf. Ketchup. Mustard. Meat flavorings and tenderizers. Bouillon cubes. Hot sauce and Tabasco sauce. Premade or packaged marinades. Premade or packaged taco seasonings. Relishes. Regular salad dressings. Where to find more information:  National Heart, Lung, and [place]: https://wilson-eaton.com/  American Heart Association: www.heart.org Summary  The DASH eating plan is a healthy eating plan that has been shown to reduce high blood pressure (hypertension). It may also reduce your risk for type 2 diabetes, heart disease, and stroke.  With the DASH eating plan, you should limit salt (sodium) intake to 2,300 mg a day. If you have hypertension, you may need to reduce your sodium intake to 1,500 mg a day.  When on the DASH eating plan, aim to eat more fresh fruits and vegetables, whole grains, lean proteins, low-fat dairy, and heart-healthy fats.  Work with your  health care provider or diet and nutrition specialist (dietitian) to adjust your eating plan to your individual calorie needs. This information is not intended to replace advice given to you by your health care provider. Make sure you discuss any questions you have with your health care provider. Document Released: 05/25/2011 Document Revised: 05/29/2016 Document Reviewed: 05/29/2016 Elsevier Interactive Patient Education  2017 [employer].

## 2017-01-12 ENCOUNTER — Other Ambulatory Visit: Payer: Self-pay | Admitting: Student

## 2017-01-12 DIAGNOSIS — M7981 Nontraumatic hematoma of soft tissue: Secondary | ICD-10-CM

## 2017-01-12 DIAGNOSIS — IMO0002 Reserved for concepts with insufficient information to code with codable children: Secondary | ICD-10-CM

## 2017-01-17 ENCOUNTER — Other Ambulatory Visit: Payer: Self-pay | Admitting: Orthopedic Surgery

## 2017-01-17 ENCOUNTER — Telehealth: Payer: Self-pay

## 2017-01-17 DIAGNOSIS — G8929 Other chronic pain: Secondary | ICD-10-CM

## 2017-01-17 DIAGNOSIS — M5441 Lumbago with sciatica, right side: Principal | ICD-10-CM

## 2017-01-17 DIAGNOSIS — M5442 Lumbago with sciatica, left side: Principal | ICD-10-CM

## 2017-01-17 MED ORDER — LISINOPRIL 10 MG PO TABS: 10.0000 mg | ORAL_TABLET | Freq: Every day | ORAL | 3 refills | Status: DC

## 2017-01-17 NOTE — Telephone Encounter (Signed)
Noted.  eRx sent for lisinopril to pharmacy and HCTZ added to allergy list.  Please make sure she has a BP follow up appt scheduled.

## 2017-01-17 NOTE — Telephone Encounter (Signed)
Pt left v/m; pt was seen on 01/10/17 for high BP and was started on HCTZ 12.5 mg; since then pt has had headaches, eyes are red and pt cannot sleep at night. Pt request different med to [facility]. Pt has f/u appt on 01/29/17.

## 2017-01-17 NOTE — Telephone Encounter (Signed)
Pt was notified appt made for 01/29/17.

## 2017-01-23 ENCOUNTER — Ambulatory Visit
Admission: RE | Admit: 2017-01-23 | Discharge: 2017-01-23 | Disposition: A | Discharge status: Home / Self Care | Payer: BLUE CROSS/BLUE SHIELD | Source: Ambulatory Visit | Attending: Student | Admitting: Student

## 2017-01-23 ENCOUNTER — Ambulatory Visit
Admission: RE | Admit: 2017-01-23 | Discharge: 2017-01-23 | Disposition: A | Discharge status: Home / Self Care | Payer: BLUE CROSS/BLUE SHIELD | Source: Ambulatory Visit | Attending: Orthopedic Surgery | Admitting: Orthopedic Surgery

## 2017-01-23 ENCOUNTER — Other Ambulatory Visit: Payer: Self-pay | Admitting: Student

## 2017-01-23 DIAGNOSIS — IMO0002 Reserved for concepts with insufficient information to code with codable children: Secondary | ICD-10-CM

## 2017-01-23 DIAGNOSIS — M7981 Nontraumatic hematoma of soft tissue: Secondary | ICD-10-CM

## 2017-01-23 DIAGNOSIS — M5136 Other intervertebral disc degeneration, lumbar region: Secondary | ICD-10-CM | POA: Diagnosis not present

## 2017-01-23 DIAGNOSIS — M7121 Synovial cyst of popliteal space [Baker], right knee: Secondary | ICD-10-CM | POA: Diagnosis not present

## 2017-01-23 DIAGNOSIS — M5441 Lumbago with sciatica, right side: Principal | ICD-10-CM

## 2017-01-23 DIAGNOSIS — M48061 Spinal stenosis, lumbar region without neurogenic claudication: Secondary | ICD-10-CM | POA: Insufficient documentation

## 2017-01-23 DIAGNOSIS — G8929 Other chronic pain: Secondary | ICD-10-CM

## 2017-01-23 DIAGNOSIS — M5442 Lumbago with sciatica, left side: Principal | ICD-10-CM

## 2017-01-23 LAB — POCT I-STAT CREATININE: CREATININE: 0.9 mg/dL (ref 0.44–1.00)

## 2017-01-23 MED ORDER — GADOBENATE DIMEGLUMINE 529 MG/ML IV SOLN
20.0000 mL | Freq: Once | INTRAVENOUS | Status: AC | PRN
  Administered 2017-01-23: 20 mL via INTRAVENOUS

## 2017-01-29 ENCOUNTER — Ambulatory Visit: Payer: BLUE CROSS/BLUE SHIELD | Admitting: Family Medicine

## 2017-02-01 ENCOUNTER — Ambulatory Visit: Payer: BLUE CROSS/BLUE SHIELD | Admitting: Family Medicine

## 2017-02-06 ENCOUNTER — Encounter (INDEPENDENT_AMBULATORY_CARE_PROVIDER_SITE_OTHER): Payer: Self-pay

## 2017-02-06 ENCOUNTER — Ambulatory Visit (INDEPENDENT_AMBULATORY_CARE_PROVIDER_SITE_OTHER): Payer: BLUE CROSS/BLUE SHIELD | Admitting: Family Medicine

## 2017-02-06 ENCOUNTER — Encounter: Payer: Self-pay | Admitting: Family Medicine

## 2017-02-06 VITALS — BP 160/108 | HR 76 | Temp 98.3°F | Wt 227.0 lb

## 2017-02-06 DIAGNOSIS — I10 Essential (primary) hypertension: Secondary | ICD-10-CM

## 2017-02-06 DIAGNOSIS — T464X5A Adverse effect of angiotensin-converting-enzyme inhibitors, initial encounter: Secondary | ICD-10-CM | POA: Diagnosis not present

## 2017-02-06 DIAGNOSIS — S8011XD Contusion of right lower leg, subsequent encounter: Secondary | ICD-10-CM | POA: Insufficient documentation

## 2017-02-06 DIAGNOSIS — R05 Cough: Secondary | ICD-10-CM | POA: Insufficient documentation

## 2017-02-06 DIAGNOSIS — R058 Other specified cough: Secondary | ICD-10-CM

## 2017-02-06 MED ORDER — AMLODIPINE BESYLATE 5 MG PO TABS
5.0000 mg | ORAL_TABLET | Freq: Every day | ORAL | 3 refills | Status: DC
Start: 2017-02-06 — End: 2018-02-13

## 2017-02-06 NOTE — Assessment & Plan Note (Signed)
D/c lisinopril- add to allergy list.

## 2017-02-06 NOTE — Patient Instructions (Signed)
Great to see you.  Please stop taking Lininopril. Start taking norvasc (amlodipine) 5 mg daily.  And come see me again in 2 weeks.
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
## 2017-02-06 NOTE — Assessment & Plan Note (Signed)
D/c lisinopril. Start amlodipine 5 mg daily. Follow up in 2 weeks for BP recheck.

## 2017-02-06 NOTE — Assessment & Plan Note (Signed)
Improving.

## 2017-02-16 ENCOUNTER — Telehealth: Payer: Self-pay

## 2017-02-16 MED ORDER — BENZONATATE 200 MG PO CAPS: 200.0000 mg | ORAL_CAPSULE | Freq: Two times a day (BID) | ORAL | 0 refills | Status: DC | PRN

## 2017-02-16 NOTE — Telephone Encounter (Signed)
Pt was seen 02/06/17; pt had been taking lisinopril and stopped due to cough; pt understands can take at least 2 weeks to get lisinopril out of her system; pt still coughing; having problems working and eating due to constant cough and request cough med with steroid until gets lisinopril out of her system. [facility] [place].

## 2017-02-16 NOTE — Telephone Encounter (Signed)
Will send in eRx for tessalon- this is NOT a steroid but it is a cough suppressant that should not make her sleepy so she can take it at work.

## 2017-02-21 ENCOUNTER — Ambulatory Visit (INDEPENDENT_AMBULATORY_CARE_PROVIDER_SITE_OTHER): Payer: BLUE CROSS/BLUE SHIELD | Admitting: Family Medicine

## 2017-02-21 ENCOUNTER — Encounter: Payer: Self-pay | Admitting: Family Medicine

## 2017-02-21 VITALS — BP 126/96 | HR 82 | Temp 98.6°F | Resp 16 | Ht 61.0 in | Wt 228.8 lb

## 2017-02-21 DIAGNOSIS — R17 Unspecified jaundice: Secondary | ICD-10-CM | POA: Diagnosis not present

## 2017-02-21 DIAGNOSIS — I10 Essential (primary) hypertension: Secondary | ICD-10-CM | POA: Diagnosis not present

## 2017-02-21 LAB — COMPREHENSIVE METABOLIC PANEL
ALT: 14 U/L (ref 0–35)
AST: 17 U/L (ref 0–37)
Albumin: 4.2 g/dL (ref 3.5–5.2)
Alkaline Phosphatase: 99 U/L (ref 39–117)
BUN: 12 mg/dL (ref 6–23)
CHLORIDE: 102 meq/L (ref 96–112)
CO2: 29 mEq/L (ref 19–32)
CREATININE: 0.83 mg/dL (ref 0.40–1.20)
Calcium: 9.6 mg/dL (ref 8.4–10.5)
GFR: 92.97 mL/min (ref >60.00)
GLUCOSE: 97 mg/dL (ref 70–99)
Potassium: 3.9 mEq/L (ref 3.5–5.1)
SODIUM: 139 meq/L (ref 135–145)
Total Bilirubin: 0.3 mg/dL (ref 0.2–1.2)
Total Protein: 6.9 g/dL (ref 6.0–8.3)

## 2017-02-21 NOTE — Assessment & Plan Note (Signed)
Improving and cough resolved. Continue norvasc at current dose.
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
## 2017-02-21 NOTE — Assessment & Plan Note (Signed)
No jaundice or scleral icterus noted on exam. Will check CMET for patient reassurance.

## 2017-02-21 NOTE — Patient Instructions (Signed)
Great to see you.  I will call your lab results.   

## 2017-02-22 NOTE — Telephone Encounter (Signed)
Patient seen 02/21/17

## 2017-04-03 ENCOUNTER — Ambulatory Visit: Payer: BLUE CROSS/BLUE SHIELD | Admitting: Family Medicine

## 2017-05-14 ENCOUNTER — Telehealth: Payer: Self-pay | Admitting: Family Medicine

## 2017-05-14 NOTE — Telephone Encounter (Signed)
Copied from [place] [id number]. Topic: Inquiry >> May 14, 2017 12:53 PM Neva Seat wrote: Needs a Z Pak called in - Chest Congestion sinus infection that has turned into chest congestion and coughing up mucus that is clear [facility]

## 2017-05-15 NOTE — Telephone Encounter (Signed)
Attempted to call pt.  Left voice message to call back to discuss symptoms.

## 2017-05-15 NOTE — Telephone Encounter (Signed)
Attempted to call Ms. Choma, no answer at this #.

## 2017-06-09 ENCOUNTER — Emergency Department: Payer: BLUE CROSS/BLUE SHIELD

## 2017-06-09 ENCOUNTER — Encounter: Payer: Self-pay | Admitting: Emergency Medicine

## 2017-06-09 ENCOUNTER — Emergency Department
Admission: EM | Admit: 2017-06-09 | Discharge: 2017-06-09 | Disposition: A | Discharge status: Home / Self Care | Payer: BLUE CROSS/BLUE SHIELD | Attending: Emergency Medicine | Admitting: Emergency Medicine

## 2017-06-09 ENCOUNTER — Other Ambulatory Visit: Payer: Self-pay

## 2017-06-09 DIAGNOSIS — Z79899 Other long term (current) drug therapy: Secondary | ICD-10-CM | POA: Insufficient documentation

## 2017-06-09 DIAGNOSIS — M79661 Pain in right lower leg: Secondary | ICD-10-CM | POA: Insufficient documentation

## 2017-06-09 DIAGNOSIS — F17200 Nicotine dependence, unspecified, uncomplicated: Secondary | ICD-10-CM | POA: Diagnosis not present

## 2017-06-09 DIAGNOSIS — I10 Essential (primary) hypertension: Secondary | ICD-10-CM | POA: Diagnosis not present

## 2017-06-09 DIAGNOSIS — M79604 Pain in right leg: Secondary | ICD-10-CM

## 2017-06-09 MED ORDER — HYDROCODONE-ACETAMINOPHEN 5-325 MG PO TABS: 1.0000 | ORAL_TABLET | Freq: Three times a day (TID) | ORAL | 0 refills | Status: DC | PRN

## 2017-06-09 NOTE — ED Notes (Signed)

## 2017-06-09 NOTE — ED Triage Notes (Addendum)
Pt c/o right leg swelling since yesterday, states she has hx of hematoma to the same leg, with the same symptoms today.  No redness or warmth felt. Pt states the pain starts at the front of her leg and radiates to the calf.   Pt states she has chronic arthritis in right knee also.  No recent travel and has been walking more and wearing a knee brace on right knee.  Pt took 2 ibuprofen and meloxicam this AM.  Pain worse with ambulation.  Has been seen for the same sxs before.

## 2017-06-09 NOTE — ED Notes (Signed)
Patient transported to Ultrasound 
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
## 2017-06-09 NOTE — Discharge Instructions (Signed)
Wear the compression stocking when working. Follow up with orthopedics. Return to the ER for symptoms that change or worsen or for new concerns if unable to see your PCP or orthopedics.

## 2017-09-24 ENCOUNTER — Encounter: Payer: Self-pay | Admitting: Family Medicine

## 2017-09-24 ENCOUNTER — Ambulatory Visit (INDEPENDENT_AMBULATORY_CARE_PROVIDER_SITE_OTHER): Payer: BLUE CROSS/BLUE SHIELD | Admitting: Family Medicine

## 2017-09-24 ENCOUNTER — Encounter: Payer: BLUE CROSS/BLUE SHIELD | Admitting: Family Medicine

## 2017-09-24 ENCOUNTER — Telehealth: Payer: Self-pay | Admitting: Family Medicine

## 2017-09-24 VITALS — BP 120/78 | HR 75 | Temp 98.6°F | Ht 61.5 in | Wt 227.8 lb

## 2017-09-24 DIAGNOSIS — Z1211 Encounter for screening for malignant neoplasm of colon: Secondary | ICD-10-CM | POA: Diagnosis not present

## 2017-09-24 DIAGNOSIS — Z0000 Encounter for general adult medical examination without abnormal findings: Secondary | ICD-10-CM | POA: Diagnosis not present

## 2017-09-24 DIAGNOSIS — Z1239 Encounter for other screening for malignant neoplasm of breast: Secondary | ICD-10-CM

## 2017-09-24 DIAGNOSIS — Z1231 Encounter for screening mammogram for malignant neoplasm of breast: Secondary | ICD-10-CM | POA: Diagnosis not present

## 2017-09-24 DIAGNOSIS — E785 Hyperlipidemia, unspecified: Secondary | ICD-10-CM | POA: Diagnosis not present

## 2017-09-24 DIAGNOSIS — E559 Vitamin D deficiency, unspecified: Secondary | ICD-10-CM | POA: Diagnosis not present

## 2017-09-24 DIAGNOSIS — Z780 Asymptomatic menopausal state: Secondary | ICD-10-CM

## 2017-09-24 DIAGNOSIS — I10 Essential (primary) hypertension: Secondary | ICD-10-CM

## 2017-09-24 LAB — CBC WITH DIFFERENTIAL/PLATELET
BASOS PCT: 1.4 % (ref 0.0–3.0)
Basophils Absolute: 0.1 10*3/uL (ref 0.0–0.1)
EOS PCT: 2 % (ref 0.0–5.0)
Eosinophils Absolute: 0.1 10*3/uL (ref 0.0–0.7)
HCT: 41.6 % (ref 36.0–46.0)
HEMOGLOBIN: 13.8 g/dL (ref 12.0–15.0)
LYMPHS ABS: 3 10*3/uL (ref 0.7–4.0)
Lymphocytes Relative: 57.7 % — ABNORMAL HIGH (ref 12.0–46.0)
MCHC: 33.3 g/dL (ref 30.0–36.0)
MCV: 90.6 fl (ref 78.0–100.0)
Monocytes Absolute: 0.5 10*3/uL (ref 0.1–1.0)
Monocytes Relative: 9 % (ref 3.0–12.0)
NEUTROS PCT: 29.9 % — AB (ref 43.0–77.0)
Neutro Abs: 1.6 10*3/uL (ref 1.4–7.7)
Platelets: 325 10*3/uL (ref 150.0–400.0)
RBC: 4.59 Mil/uL (ref 3.87–5.11)
RDW: 14.7 % (ref 11.5–15.5)
WBC: 5.2 10*3/uL (ref 4.0–10.5)

## 2017-09-24 LAB — COMPREHENSIVE METABOLIC PANEL
ALBUMIN: 4.1 g/dL (ref 3.5–5.2)
ALT: 12 U/L (ref 0–35)
AST: 15 U/L (ref 0–37)
Alkaline Phosphatase: 97 U/L (ref 39–117)
BUN: 13 mg/dL (ref 6–23)
CHLORIDE: 105 meq/L (ref 96–112)
CO2: 31 mEq/L (ref 19–32)
Calcium: 9.7 mg/dL (ref 8.4–10.5)
Creatinine, Ser: 0.82 mg/dL (ref 0.40–1.20)
GFR: 94.07 mL/min (ref >60.00)
Glucose, Bld: 90 mg/dL (ref 70–99)
POTASSIUM: 4.2 meq/L (ref 3.5–5.1)
Sodium: 142 mEq/L (ref 135–145)
Total Bilirubin: 0.5 mg/dL (ref 0.2–1.2)
Total Protein: 7.2 g/dL (ref 6.0–8.3)

## 2017-09-24 LAB — LIPID PANEL
CHOLESTEROL: 221 mg/dL — AB (ref 0–200)
HDL: 50 mg/dL (ref >39.00)
LDL CALC: 144 mg/dL — AB (ref 0–99)
NonHDL: 171.41
TRIGLYCERIDES: 138 mg/dL (ref 0.0–149.0)
Total CHOL/HDL Ratio: 4
VLDL: 27.6 mg/dL (ref 0.0–40.0)

## 2017-09-24 LAB — TSH: TSH: 1.04 u[IU]/mL (ref 0.35–4.50)

## 2017-09-24 LAB — VITAMIN D 25 HYDROXY (VIT D DEFICIENCY, FRACTURES): VITD: 17.64 ng/mL — ABNORMAL LOW (ref 30.00–100.00)

## 2017-09-24 MED ORDER — NYSTATIN 100000 UNIT/GM EX POWD: Freq: Four times a day (QID) | CUTANEOUS | 0 refills | Status: DC
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
## 2017-09-24 NOTE — Addendum Note (Signed)
Addended by: Lucille Passy on: 09/24/2017 10:54 AM   Modules accepted: Orders

## 2017-09-24 NOTE — Patient Instructions (Signed)
Great to see you. Please call [facility] to schedule your mammogram and bone density.  Great to see you. I will call you with your lab results from today and you can view them online.

## 2017-09-24 NOTE — Telephone Encounter (Signed)
Copied from [place]. Topic: Quick Communication - See Telephone Encounter >> Sep 24, 2017 11:25 AM Aurelio Brash B wrote: CRM for notification. See Telephone encounter for: 09/24/17.  PT [facility], [facility] to [facility]  nystatin (MYCOSTATIN/NYSTOP) powder  [id number] [place], [place] [address] [phone] (Phone) [phone] (Fax)

## 2017-09-24 NOTE — Telephone Encounter (Signed)
Completed/thx dmf 

## 2017-09-25 MED ORDER — VITAMIN D (ERGOCALCIFEROL) 1.25 MG (50000 UNIT) PO CAPS: 50000.0000 [IU] | ORAL_CAPSULE | ORAL | 0 refills | Status: DC

## 2017-09-25 NOTE — Addendum Note (Signed)
Addended by: Milford Cage on: 09/25/2017 01:58 PM   Modules accepted: Orders

## 2017-10-18 ENCOUNTER — Other Ambulatory Visit: Payer: Self-pay | Admitting: Family Medicine

## 2017-10-18 NOTE — Telephone Encounter (Signed)
Patient is requesting a larger sized bottle.

## 2017-10-22 ENCOUNTER — Encounter: Payer: Self-pay | Admitting: *Deleted

## 2017-11-06 ENCOUNTER — Other Ambulatory Visit: Payer: BLUE CROSS/BLUE SHIELD

## 2017-11-21 ENCOUNTER — Ambulatory Visit
Admission: RE | Admit: 2017-11-21 | Discharge: 2017-11-21 | Disposition: A | Discharge status: Home / Self Care | Payer: BLUE CROSS/BLUE SHIELD | Source: Ambulatory Visit | Attending: Family Medicine | Admitting: Family Medicine

## 2017-11-21 DIAGNOSIS — Z1231 Encounter for screening mammogram for malignant neoplasm of breast: Secondary | ICD-10-CM | POA: Diagnosis not present

## 2017-11-21 DIAGNOSIS — Z1239 Encounter for other screening for malignant neoplasm of breast: Secondary | ICD-10-CM

## 2017-12-11 ENCOUNTER — Other Ambulatory Visit: Payer: BLUE CROSS/BLUE SHIELD

## 2017-12-21 ENCOUNTER — Emergency Department: Payer: BLUE CROSS/BLUE SHIELD

## 2017-12-21 ENCOUNTER — Emergency Department
Admission: EM | Admit: 2017-12-21 | Discharge: 2017-12-21 | Disposition: A | Discharge status: Home / Self Care | Payer: BLUE CROSS/BLUE SHIELD | Attending: Emergency Medicine | Admitting: Emergency Medicine

## 2017-12-21 ENCOUNTER — Other Ambulatory Visit: Payer: Self-pay

## 2017-12-21 DIAGNOSIS — M79661 Pain in right lower leg: Secondary | ICD-10-CM | POA: Insufficient documentation

## 2017-12-21 DIAGNOSIS — Z87891 Personal history of nicotine dependence: Secondary | ICD-10-CM | POA: Diagnosis not present

## 2017-12-21 DIAGNOSIS — Z79899 Other long term (current) drug therapy: Secondary | ICD-10-CM | POA: Insufficient documentation

## 2017-12-21 DIAGNOSIS — I10 Essential (primary) hypertension: Secondary | ICD-10-CM | POA: Insufficient documentation

## 2017-12-21 NOTE — ED Triage Notes (Signed)
Pt complains of right calf pain and intermittent "numbing" feeling. Pt states has been present 2-3 days. Pt denies hormones, states is an intermittent smoker.
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
## 2018-01-01 ENCOUNTER — Inpatient Hospital Stay: Payer: BLUE CROSS/BLUE SHIELD | Admitting: Family Medicine

## 2018-01-17 ENCOUNTER — Telehealth: Payer: Self-pay | Admitting: Family Medicine

## 2018-01-17 MED ORDER — ESOMEPRAZOLE MAGNESIUM 40 MG PO CPDR: 40.0000 mg | DELAYED_RELEASE_CAPSULE | Freq: Every day | ORAL | 3 refills | Status: DC

## 2018-01-17 NOTE — Telephone Encounter (Signed)
Left a vm for the pt to call back, need to inform the pt of Nexium sent in to [facility].

## 2018-01-17 NOTE — Telephone Encounter (Signed)
Dr. Deborra Medina please advise, last ov with you was 09/2017. We gave her Nexium 40 mg 1 daily in th past (discontinued now), ok to send in or you wants to see the pt in ov first?      Copied from [place] [phone]. Topic: General - Other >> Jan 17, 2018  3:22 PM Oneta Rack wrote: Relation to pt: self  Call back [phone] [id number] [place], [place] [address] [phone] ([phone] (Fax)  Reason for call:  Patient states she's experiencing heart burn and requesting Rx, patient states she doesn't know the name of Rx prescribed In the past, please advise

## 2018-01-17 NOTE — Telephone Encounter (Signed)
Yes okay to restart Nexium 40 mg daily - please send in rx- #30 with 3 refills.

## 2018-01-18 NOTE — Telephone Encounter (Signed)
LMOVM that Dr. Deborra Medina approved restart of the medication that she was requesting and it was sent to the pharmacy for 30d+3 and to let us know if there is anything else that we could do for her/thx dmf

## 2018-01-25 ENCOUNTER — Other Ambulatory Visit: Payer: Self-pay | Admitting: Family Medicine

## 2018-02-13 ENCOUNTER — Other Ambulatory Visit: Payer: Self-pay | Admitting: Family Medicine

## 2018-05-07 ENCOUNTER — Emergency Department: Payer: BLUE CROSS/BLUE SHIELD

## 2018-05-07 ENCOUNTER — Other Ambulatory Visit: Payer: Self-pay

## 2018-05-07 ENCOUNTER — Encounter: Payer: Self-pay | Admitting: Emergency Medicine

## 2018-05-07 ENCOUNTER — Emergency Department
Admission: EM | Admit: 2018-05-07 | Discharge: 2018-05-07 | Disposition: A | Discharge status: Home / Self Care | Payer: BLUE CROSS/BLUE SHIELD | Attending: Emergency Medicine | Admitting: Emergency Medicine

## 2018-05-07 DIAGNOSIS — Z79899 Other long term (current) drug therapy: Secondary | ICD-10-CM | POA: Insufficient documentation

## 2018-05-07 DIAGNOSIS — M5481 Occipital neuralgia: Secondary | ICD-10-CM

## 2018-05-07 DIAGNOSIS — D329 Benign neoplasm of meninges, unspecified: Secondary | ICD-10-CM | POA: Diagnosis not present

## 2018-05-07 DIAGNOSIS — Z87891 Personal history of nicotine dependence: Secondary | ICD-10-CM | POA: Insufficient documentation

## 2018-05-07 DIAGNOSIS — R51 Headache: Secondary | ICD-10-CM | POA: Diagnosis present

## 2018-05-07 MED ORDER — GABAPENTIN 100 MG PO CAPS: 100.0000 mg | ORAL_CAPSULE | Freq: Three times a day (TID) | ORAL | 0 refills | Status: DC

## 2018-05-07 NOTE — ED Notes (Signed)
ED Provider at bedside. 

## 2018-05-07 NOTE — ED Triage Notes (Signed)
Pt states for "a while" she has had intermittent sharp headache pains in the back of her head. She states they last for minutes. Pt states initially they only happened once or twice a month but "here lately" they have been more frequent. Pt denies any current pain but wants to have the pain evaluated.
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
## 2018-05-07 NOTE — ED Notes (Signed)
FIRST NURSE NOTE:  Pt c/o sharp pains in the back of the head for the past day, pt states she normally gets these pains, but usually they come and go.  Pt neurologically intact at registration. Ambulatory without difficulty.

## 2018-05-09 ENCOUNTER — Other Ambulatory Visit: Payer: Self-pay | Admitting: Neurosurgery

## 2018-05-09 DIAGNOSIS — D329 Benign neoplasm of meninges, unspecified: Secondary | ICD-10-CM

## 2018-05-26 ENCOUNTER — Ambulatory Visit
Admission: RE | Admit: 2018-05-26 | Discharge: 2018-05-26 | Disposition: A | Discharge status: Home / Self Care | Payer: BLUE CROSS/BLUE SHIELD | Source: Ambulatory Visit | Attending: Neurosurgery | Admitting: Neurosurgery

## 2018-05-26 DIAGNOSIS — D329 Benign neoplasm of meninges, unspecified: Secondary | ICD-10-CM

## 2018-05-26 MED ORDER — GADOBENATE DIMEGLUMINE 529 MG/ML IV SOLN
20.0000 mL | Freq: Once | INTRAVENOUS | Status: AC | PRN
  Administered 2018-05-26: 20 mL via INTRAVENOUS

## 2018-05-31 ENCOUNTER — Ambulatory Visit: Payer: BLUE CROSS/BLUE SHIELD

## 2018-09-26 ENCOUNTER — Encounter: Payer: BLUE CROSS/BLUE SHIELD | Admitting: Family Medicine

## 2018-11-19 ENCOUNTER — Other Ambulatory Visit (HOSPITAL_COMMUNITY): Payer: Self-pay | Admitting: Neurosurgery

## 2018-11-19 ENCOUNTER — Other Ambulatory Visit: Payer: Self-pay | Admitting: Neurosurgery

## 2018-11-19 DIAGNOSIS — D329 Benign neoplasm of meninges, unspecified: Secondary | ICD-10-CM

## 2018-12-15 ENCOUNTER — Ambulatory Visit: Payer: BC Managed Care – PPO

## 2018-12-27 ENCOUNTER — Other Ambulatory Visit: Payer: Self-pay

## 2018-12-27 ENCOUNTER — Ambulatory Visit
Admission: RE | Admit: 2018-12-27 | Discharge: 2018-12-27 | Disposition: A | Discharge status: Home / Self Care | Payer: BC Managed Care – PPO | Source: Ambulatory Visit | Attending: Neurosurgery | Admitting: Neurosurgery

## 2018-12-27 DIAGNOSIS — D329 Benign neoplasm of meninges, unspecified: Secondary | ICD-10-CM | POA: Diagnosis not present

## 2018-12-27 LAB — POCT I-STAT CREATININE: Creatinine, Ser: 0.8 mg/dL (ref 0.44–1.00)

## 2018-12-27 MED ORDER — GADOBUTROL 1 MMOL/ML IV SOLN
10.0000 mL | Freq: Once | INTRAVENOUS | Status: AC | PRN
  Administered 2018-12-27: 10 mL via INTRAVENOUS

## 2019-01-13 ENCOUNTER — Telehealth: Payer: Self-pay

## 2019-01-13 DIAGNOSIS — Z20822 Contact with and (suspected) exposure to covid-19: Secondary | ICD-10-CM

## 2019-01-13 DIAGNOSIS — Z20828 Contact with and (suspected) exposure to other viral communicable diseases: Secondary | ICD-10-CM

## 2019-01-13 NOTE — Addendum Note (Signed)
Addended by: Lucille Passy on: 01/13/2019 01:52 PM   Modules accepted: Orders

## 2019-01-13 NOTE — Addendum Note (Signed)
Addended by: Marrion Coy on: 01/13/2019 03:58 PM   Modules accepted: Orders

## 2019-01-13 NOTE — Telephone Encounter (Signed)
TA-Plz see note/pt's sister is in the hospital for COVID/pt is not allowed to return to work prior to testing/plz advise/thx dmf

## 2019-01-13 NOTE — Telephone Encounter (Signed)
Copied from [place] [id number]. Topic: General - Other >> Jan 13, 2019 11:46 AM Pauline Good wrote: Pt has been exposed to her sister that is in the hospital for Covid and pt can't return to work until she has a test. Please call pt to advise

## 2019-01-13 NOTE — Telephone Encounter (Signed)
Lab order entered.  Please tell her the following and give her the location that works best for [employer] are not required   [facility] Location: [place], [place] (old [facility] [place]) Hours: 8a-4p, M-F   [facility] Location: [address] [place], [place] [address]  [facility] ([place]) Hours: 8a-4p, M-F   [facility] Location: [place] [facility] (across from [place] [facility]) Hours: 8a-4p, M-F Planned Start Date Thursday 5/14 or

## 2019-01-14 NOTE — Telephone Encounter (Signed)
LMOVM stating locations and addresses for testing/thx dmf

## 2019-01-20 DIAGNOSIS — Z6841 Body Mass Index (BMI) 40.0 and over, adult: Secondary | ICD-10-CM | POA: Insufficient documentation

## 2019-02-10 ENCOUNTER — Other Ambulatory Visit: Payer: Self-pay | Admitting: Student

## 2019-02-10 DIAGNOSIS — M1712 Unilateral primary osteoarthritis, left knee: Secondary | ICD-10-CM

## 2019-02-10 DIAGNOSIS — S83242A Other tear of medial meniscus, current injury, left knee, initial encounter: Secondary | ICD-10-CM

## 2019-02-19 ENCOUNTER — Other Ambulatory Visit: Payer: Self-pay

## 2019-02-19 ENCOUNTER — Ambulatory Visit
Admission: RE | Admit: 2019-02-19 | Discharge: 2019-02-19 | Disposition: A | Discharge status: Home / Self Care | Payer: BC Managed Care – PPO | Source: Ambulatory Visit | Attending: Student | Admitting: Student

## 2019-02-19 DIAGNOSIS — S83242A Other tear of medial meniscus, current injury, left knee, initial encounter: Secondary | ICD-10-CM | POA: Insufficient documentation

## 2019-02-19 DIAGNOSIS — M1712 Unilateral primary osteoarthritis, left knee: Secondary | ICD-10-CM | POA: Diagnosis present

## 2019-03-03 ENCOUNTER — Other Ambulatory Visit: Payer: Self-pay | Admitting: Family Medicine

## 2019-03-03 MED ORDER — AMLODIPINE BESYLATE 5 MG PO TABS: 5.0000 mg | ORAL_TABLET | Freq: Every day | ORAL | 0 refills | Status: DC

## 2019-03-03 NOTE — Telephone Encounter (Signed)
Requested medication (s) are due for refill today: yes  Requested medication (s) are on the active medication list: yes  Last refill:  07/2018  Future visit scheduled: no  Notes to clinic:  Review for refill   Requested Prescriptions  Pending Prescriptions Disp Refills   amLODipine (NORVASC) 5 MG tablet 90 tablet 2    Sig: Take 1 tablet (5 mg total) by mouth daily.     Cardiovascular:  Calcium Channel Blockers Failed - 03/03/2019  3:03 PM      Failed - Last BP in normal range    BP Readings from Last 1 Encounters:  05/07/18 (!) 152/91         Failed - Valid encounter within last 6 months    Recent Outpatient Visits          1 year ago Well woman exam without gynecological exam   [facility] Senters, Marciano Sequin, MD

## 2019-03-03 NOTE — Telephone Encounter (Signed)
amLODipine (NORVASC) 5 MG tablet   Send to [place]

## 2019-03-10 DIAGNOSIS — M1712 Unilateral primary osteoarthritis, left knee: Secondary | ICD-10-CM | POA: Insufficient documentation

## 2019-03-10 DIAGNOSIS — S83232A Complex tear of medial meniscus, current injury, left knee, initial encounter: Secondary | ICD-10-CM | POA: Insufficient documentation

## 2019-04-07 ENCOUNTER — Other Ambulatory Visit: Payer: Self-pay | Admitting: Surgery

## 2019-04-08 ENCOUNTER — Other Ambulatory Visit: Admission: RE | Admit: 2019-04-08 | Payer: BC Managed Care – PPO | Source: Ambulatory Visit

## 2019-04-09 ENCOUNTER — Other Ambulatory Visit: Payer: Self-pay

## 2019-04-09 ENCOUNTER — Encounter
Admission: RE | Admit: 2019-04-09 | Discharge: 2019-04-09 | Disposition: A | Discharge status: Home / Self Care | Payer: BC Managed Care – PPO | Source: Ambulatory Visit | Attending: Surgery | Admitting: Surgery

## 2019-04-09 DIAGNOSIS — I10 Essential (primary) hypertension: Secondary | ICD-10-CM | POA: Insufficient documentation

## 2019-04-09 HISTORY — DX: Cardiac murmur, unspecified: R01.1

## 2019-04-09 HISTORY — DX: Anxiety disorder, unspecified: F41.9

## 2019-04-09 HISTORY — DX: Unspecified osteoarthritis, unspecified site: M19.90

## 2019-04-09 HISTORY — DX: Essential (primary) hypertension: I10

## 2019-04-09 NOTE — Patient Instructions (Signed)
Your procedure is scheduled on: 04-15-19 TUESDAY Report to Same Day Surgery 2nd floor medical mall [facility] Entrance-take elevator on left to 2nd floor.  Check in with surgery information desk.) To find out your arrival time please call [phone] between 1PM - 3PM on 04-14-19 MONDAY  Remember: Instructions that are not followed completely may result in serious medical risk, up to and including death, or upon the discretion of your surgeon and anesthesiologist your surgery may need to be rescheduled.    _x___ 1. Do not eat food after midnight the night before your procedure. NO GUM OR CANDY AFTER MIDNIGHT. You may drink clear liquids up to 2 hours before you are scheduled to arrive at the hospital for your procedure.  Do not drink clear liquids within 2 hours of your scheduled arrival to the hospital.  Clear liquids include  --Water or Apple juice without pulp  --Gatorade  --Black Coffee or Clear Tea (No milk, no creamers, do not add anything to the coffee or Tea   ____Ensure clear carbohydrate drink on the way to the hospital for bariatric patients  ____Ensure clear carbohydrate drink 3 hours before surgery.     __x__ 2. No Alcohol for 24 hours before or after surgery.   __x__3. No Smoking or e-cigarettes for 24 prior to surgery.  Do not use any chewable tobacco products for at least 6 hour prior to surgery   ____  4. Bring all medications with you on the day of surgery if instructed.    __x__ 5. Notify your doctor if there is any change in your medical condition     (cold, fever, infections).    x___6. On the morning of surgery brush your teeth with toothpaste and water.  You may rinse your mouth with mouth wash if you wish.  Do not swallow any toothpaste or mouthwash.   Do not wear jewelry, make-up, hairpins, clips or nail polish.  Do not wear lotions, powders, or perfumes.   Do not shave 48 hours prior to surgery. Men may shave face and neck.  Do not bring valuables to  the hospital.    [facility] is not responsible for any belongings or valuables.               Contacts, dentures or bridgework may not be worn into surgery.  Leave your suitcase in the car. After surgery it may be brought to your room.  For patients admitted to the hospital, discharge time is determined by your treatment team.  _  Patients discharged the day of surgery will not be allowed to drive home.  You will need someone to drive you home and stay with you the night of your procedure.    Please read over the following fact sheets that you were given:   [facility] Preparing for Surgery   _x___ TAKE THE FOLLOWING MEDICATION THE MORNING OF SURGERY WITH A SMALL SIP OF WATER. These include:  1. NORVASC (AMLODIPINE)  2. GABAPENTIN (NEURONTIN)  3.  4.  5.  6.  ____Fleets enema or Magnesium Citrate as directed.   _x___ Use CHG Soap or sage wipes as directed on instruction sheet   ____ Use inhalers on the day of surgery and bring to hospital day of surgery  ____ Stop Metformin and Janumet 2 days prior to surgery.    ____ Take 1/2 of usual insulin dose the night before surgery and none on the morning surgery.   ____ Follow recommendations from Cardiologist, Pulmonologist  or PCP regarding stopping Aspirin, Coumadin, Plavix ,Eliquis, Effient, or Pradaxa, and Pletal.  X____Stop Anti-inflammatories such as Advil, Aleve, Ibuprofen, Motrin, Naproxen, MELOXICAM (MOBIC) Naprosyn, Goodies powders or aspirin products NOW-OK to take Tylenol    ____ Stop supplements until after surgery.   ____ Bring C-Pap to the hospital.

## 2019-04-11 ENCOUNTER — Other Ambulatory Visit
Admission: RE | Admit: 2019-04-11 | Discharge: 2019-04-11 | Disposition: A | Discharge status: Home / Self Care | Payer: BC Managed Care – PPO | Source: Ambulatory Visit | Attending: Surgery | Admitting: Surgery

## 2019-04-11 ENCOUNTER — Other Ambulatory Visit: Payer: Self-pay

## 2019-04-11 DIAGNOSIS — Z01812 Encounter for preprocedural laboratory examination: Secondary | ICD-10-CM | POA: Insufficient documentation

## 2019-04-11 DIAGNOSIS — Z20828 Contact with and (suspected) exposure to other viral communicable diseases: Secondary | ICD-10-CM | POA: Diagnosis not present

## 2019-04-11 LAB — SARS CORONAVIRUS 2 (TAT 6-24 HRS): SARS Coronavirus 2: NEGATIVE

## 2019-04-15 ENCOUNTER — Encounter: Payer: Self-pay | Admitting: *Deleted

## 2019-04-15 ENCOUNTER — Ambulatory Visit: Payer: BC Managed Care – PPO | Admitting: Anesthesiology

## 2019-04-15 ENCOUNTER — Encounter
Admission: RE | Disposition: A | Discharge status: Home / Self Care | Payer: Self-pay | Source: Home / Self Care | Attending: Surgery

## 2019-04-15 ENCOUNTER — Ambulatory Visit
Admission: RE | Admit: 2019-04-15 | Discharge: 2019-04-15 | Disposition: A | Discharge status: Home / Self Care | Payer: BC Managed Care – PPO | Attending: Surgery | Admitting: Surgery

## 2019-04-15 ENCOUNTER — Other Ambulatory Visit: Payer: Self-pay

## 2019-04-15 DIAGNOSIS — Z888 Allergy status to other drugs, medicaments and biological substances status: Secondary | ICD-10-CM | POA: Diagnosis not present

## 2019-04-15 DIAGNOSIS — K219 Gastro-esophageal reflux disease without esophagitis: Secondary | ICD-10-CM | POA: Diagnosis not present

## 2019-04-15 DIAGNOSIS — Z791 Long term (current) use of non-steroidal anti-inflammatories (NSAID): Secondary | ICD-10-CM | POA: Insufficient documentation

## 2019-04-15 DIAGNOSIS — M1712 Unilateral primary osteoarthritis, left knee: Secondary | ICD-10-CM | POA: Insufficient documentation

## 2019-04-15 DIAGNOSIS — Z79899 Other long term (current) drug therapy: Secondary | ICD-10-CM | POA: Diagnosis not present

## 2019-04-15 DIAGNOSIS — F17200 Nicotine dependence, unspecified, uncomplicated: Secondary | ICD-10-CM | POA: Insufficient documentation

## 2019-04-15 DIAGNOSIS — Z882 Allergy status to sulfonamides status: Secondary | ICD-10-CM | POA: Insufficient documentation

## 2019-04-15 DIAGNOSIS — E559 Vitamin D deficiency, unspecified: Secondary | ICD-10-CM | POA: Diagnosis not present

## 2019-04-15 DIAGNOSIS — Z6841 Body Mass Index (BMI) 40.0 and over, adult: Secondary | ICD-10-CM | POA: Diagnosis not present

## 2019-04-15 DIAGNOSIS — M94262 Chondromalacia, left knee: Secondary | ICD-10-CM | POA: Diagnosis not present

## 2019-04-15 DIAGNOSIS — I10 Essential (primary) hypertension: Secondary | ICD-10-CM | POA: Diagnosis not present

## 2019-04-15 DIAGNOSIS — X58XXXA Exposure to other specified factors, initial encounter: Secondary | ICD-10-CM | POA: Insufficient documentation

## 2019-04-15 DIAGNOSIS — I2510 Atherosclerotic heart disease of native coronary artery without angina pectoris: Secondary | ICD-10-CM | POA: Diagnosis not present

## 2019-04-15 DIAGNOSIS — S83242A Other tear of medial meniscus, current injury, left knee, initial encounter: Secondary | ICD-10-CM | POA: Insufficient documentation

## 2019-04-15 HISTORY — PX: KNEE ARTHROSCOPY WITH MEDIAL MENISECTOMY: SHX5651

## 2019-04-15 LAB — POCT PREGNANCY, URINE: Preg Test, Ur: NEGATIVE

## 2019-04-15 SURGERY — ARTHROSCOPY, KNEE, WITH MEDIAL MENISCECTOMY
Anesthesia: General | Site: Knee | Laterality: Left

## 2019-04-15 MED ORDER — METOCLOPRAMIDE HCL 10 MG PO TABS: 5.0000 mg | ORAL_TABLET | Freq: Three times a day (TID) | ORAL | Status: DC | PRN

## 2019-04-15 MED ORDER — ACETAMINOPHEN 10 MG/ML IV SOLN
INTRAVENOUS | Status: DC | PRN
  Administered 2019-04-15: 1000 mg via INTRAVENOUS

## 2019-04-15 MED ORDER — FENTANYL CITRATE (PF) 100 MCG/2ML IJ SOLN
INTRAMUSCULAR | Status: AC
  Administered 2019-04-15: 16:00:00 25 ug via INTRAVENOUS
  Filled 2019-04-15: qty 2

## 2019-04-15 MED ORDER — OXYCODONE HCL 5 MG PO TABS: 5.0000 mg | ORAL_TABLET | ORAL | 0 refills | Status: DC | PRN

## 2019-04-15 MED ORDER — CEFAZOLIN SODIUM-DEXTROSE 2-4 GM/100ML-% IV SOLN
2.0000 g | INTRAVENOUS | Status: AC
  Administered 2019-04-15: 2 g via INTRAVENOUS

## 2019-04-15 MED ORDER — ROCURONIUM BROMIDE 100 MG/10ML IV SOLN
INTRAVENOUS | Status: DC | PRN
  Administered 2019-04-15: 50 mg via INTRAVENOUS

## 2019-04-15 MED ORDER — ONDANSETRON HCL 4 MG/2ML IJ SOLN
4.0000 mg | Freq: Once | INTRAMUSCULAR | Status: AC
  Administered 2019-04-15: 16:00:00 4 mg via INTRAVENOUS

## 2019-04-15 MED ORDER — PROPOFOL 10 MG/ML IV BOLUS
INTRAVENOUS | Status: AC
  Filled 2019-04-15: qty 20

## 2019-04-15 MED ORDER — POTASSIUM CHLORIDE IN NACL 20-0.9 MEQ/L-% IV SOLN: INTRAVENOUS | Status: DC

## 2019-04-15 MED ORDER — ONDANSETRON HCL 4 MG/2ML IJ SOLN
INTRAMUSCULAR | Status: DC | PRN
  Administered 2019-04-15: 4 mg via INTRAVENOUS

## 2019-04-15 MED ORDER — LACTATED RINGERS IV SOLN
INTRAVENOUS | Status: DC
  Administered 2019-04-15 (×2): via INTRAVENOUS

## 2019-04-15 MED ORDER — PHENYLEPHRINE HCL (PRESSORS) 10 MG/ML IV SOLN
INTRAVENOUS | Status: DC | PRN
  Administered 2019-04-15 (×3): 100 ug via INTRAVENOUS

## 2019-04-15 MED ORDER — OXYCODONE HCL 5 MG PO TABS: 10.0000 mg | ORAL_TABLET | ORAL | Status: DC | PRN

## 2019-04-15 MED ORDER — FENTANYL CITRATE (PF) 100 MCG/2ML IJ SOLN
INTRAMUSCULAR | Status: AC
  Administered 2019-04-15: 25 ug via INTRAVENOUS
  Filled 2019-04-15: qty 2

## 2019-04-15 MED ORDER — FENTANYL CITRATE (PF) 100 MCG/2ML IJ SOLN
INTRAMUSCULAR | Status: AC
  Filled 2019-04-15: qty 2

## 2019-04-15 MED ORDER — MIDAZOLAM HCL 2 MG/2ML IJ SOLN
INTRAMUSCULAR | Status: DC | PRN
  Administered 2019-04-15: 2 mg via INTRAVENOUS

## 2019-04-15 MED ORDER — MIDAZOLAM HCL 2 MG/2ML IJ SOLN
INTRAMUSCULAR | Status: AC
  Filled 2019-04-15: qty 2

## 2019-04-15 MED ORDER — LABETALOL HCL 5 MG/ML IV SOLN
INTRAVENOUS | Status: DC | PRN
  Administered 2019-04-15: 10 mg via INTRAVENOUS

## 2019-04-15 MED ORDER — FENTANYL CITRATE (PF) 100 MCG/2ML IJ SOLN
25.0000 ug | INTRAMUSCULAR | Status: DC | PRN
  Administered 2019-04-15 (×5): 25 ug via INTRAVENOUS

## 2019-04-15 MED ORDER — OXYCODONE HCL 5 MG PO TABS: 5.0000 mg | ORAL_TABLET | ORAL | Status: DC | PRN

## 2019-04-15 MED ORDER — ROCURONIUM BROMIDE 50 MG/5ML IV SOLN
INTRAVENOUS | Status: AC
  Filled 2019-04-15: qty 1

## 2019-04-15 MED ORDER — DEXAMETHASONE SODIUM PHOSPHATE 4 MG/ML IJ SOLN
INTRAMUSCULAR | Status: DC | PRN
  Administered 2019-04-15: 10 mg via INTRAVENOUS

## 2019-04-15 MED ORDER — OXYCODONE HCL 5 MG PO TABS: 5.0000 mg | ORAL_TABLET | Freq: Once | ORAL | Status: DC | PRN

## 2019-04-15 MED ORDER — BUPIVACAINE-EPINEPHRINE (PF) 0.5% -1:200000 IJ SOLN
INTRAMUSCULAR | Status: DC | PRN
  Administered 2019-04-15 (×2): 30 mL via PERINEURAL

## 2019-04-15 MED ORDER — FENTANYL CITRATE (PF) 100 MCG/2ML IJ SOLN
INTRAMUSCULAR | Status: DC | PRN
  Administered 2019-04-15: 25 ug via INTRAVENOUS
  Administered 2019-04-15: 50 ug via INTRAVENOUS
  Administered 2019-04-15: 25 ug via INTRAVENOUS

## 2019-04-15 MED ORDER — ACETAMINOPHEN 10 MG/ML IV SOLN
INTRAVENOUS | Status: AC
  Filled 2019-04-15: qty 100

## 2019-04-15 MED ORDER — HYDROCODONE-ACETAMINOPHEN 5-325 MG PO TABS: 1.0000 | ORAL_TABLET | Freq: Four times a day (QID) | ORAL | 0 refills | Status: DC | PRN

## 2019-04-15 MED ORDER — FAMOTIDINE 20 MG PO TABS
20.0000 mg | ORAL_TABLET | Freq: Once | ORAL | Status: AC
  Administered 2019-04-15: 20 mg via ORAL

## 2019-04-15 MED ORDER — OXYCODONE HCL 5 MG/5ML PO SOLN: 5.0000 mg | Freq: Once | ORAL | Status: DC | PRN

## 2019-04-15 MED ORDER — LIDOCAINE HCL (CARDIAC) PF 100 MG/5ML IV SOSY
PREFILLED_SYRINGE | INTRAVENOUS | Status: DC | PRN
  Administered 2019-04-15: 100 mg via INTRAVENOUS

## 2019-04-15 MED ORDER — METOCLOPRAMIDE HCL 5 MG/ML IJ SOLN: 5.0000 mg | Freq: Three times a day (TID) | INTRAMUSCULAR | Status: DC | PRN

## 2019-04-15 MED ORDER — GLYCOPYRROLATE 0.2 MG/ML IJ SOLN
INTRAMUSCULAR | Status: AC
  Filled 2019-04-15: qty 1

## 2019-04-15 MED ORDER — FAMOTIDINE 20 MG PO TABS
ORAL_TABLET | ORAL | Status: AC
  Filled 2019-04-15: qty 1

## 2019-04-15 MED ORDER — PROPOFOL 10 MG/ML IV BOLUS
INTRAVENOUS | Status: DC | PRN
  Administered 2019-04-15: 100 mg via INTRAVENOUS

## 2019-04-15 MED ORDER — ONDANSETRON HCL 4 MG/2ML IJ SOLN: 4.0000 mg | Freq: Four times a day (QID) | INTRAMUSCULAR | Status: DC | PRN

## 2019-04-15 MED ORDER — ONDANSETRON HCL 4 MG PO TABS: 4.0000 mg | ORAL_TABLET | Freq: Four times a day (QID) | ORAL | Status: DC | PRN

## 2019-04-15 MED ORDER — GLYCOPYRROLATE 0.2 MG/ML IJ SOLN
INTRAMUSCULAR | Status: DC | PRN
  Administered 2019-04-15: 0.2 mg via INTRAVENOUS

## 2019-04-15 MED ORDER — LIDOCAINE HCL (PF) 2 % IJ SOLN
INTRAMUSCULAR | Status: AC
  Filled 2019-04-15: qty 10

## 2019-04-15 MED ORDER — LABETALOL HCL 5 MG/ML IV SOLN
INTRAVENOUS | Status: AC
  Filled 2019-04-15: qty 4

## 2019-04-15 MED ORDER — ONDANSETRON HCL 4 MG/2ML IJ SOLN
INTRAMUSCULAR | Status: AC
  Filled 2019-04-15: qty 2

## 2019-04-15 MED ORDER — LIDOCAINE HCL 1 % IJ SOLN
INTRAMUSCULAR | Status: DC | PRN
  Administered 2019-04-15: 30 mL

## 2019-04-15 MED ORDER — EPHEDRINE SULFATE 50 MG/ML IJ SOLN
INTRAMUSCULAR | Status: DC | PRN
  Administered 2019-04-15: 10 mg via INTRAVENOUS

## 2019-04-15 MED ORDER — CEFAZOLIN SODIUM-DEXTROSE 2-4 GM/100ML-% IV SOLN
INTRAVENOUS | Status: AC
  Filled 2019-04-15: qty 100

## 2019-04-15 SURGICAL SUPPLY — 43 items
"PENCIL ELECTRO HAND CTR " (MISCELLANEOUS) ×1 IMPLANT
APL PRP STRL LF DISP 70% ISPRP (MISCELLANEOUS) ×1
BAG COUNTER SPONGE EZ (MISCELLANEOUS) IMPLANT
BAG SPNG 4X4 CLR HAZ (MISCELLANEOUS)
BLADE FULL RADIUS 3.5 (BLADE) ×3 IMPLANT
BLADE SHAVER 4.5X7 STR FR (MISCELLANEOUS) ×3 IMPLANT
BNDG ELASTIC 6X5.8 VLCR STR LF (GAUZE/BANDAGES/DRESSINGS) ×3 IMPLANT
CHLORAPREP W/TINT 26 (MISCELLANEOUS) ×3 IMPLANT
COUNTER SPONGE BAG EZ (MISCELLANEOUS)
COVER WAND RF STERILE (DRAPES) ×3 IMPLANT
CUFF TOURN SGL QUICK 42 (TOURNIQUET CUFF) ×2 IMPLANT
DRAPE SPLIT 6X30 W/TAPE (DRAPES) ×3 IMPLANT
ELECT REM PT RETURN 9FT ADLT (ELECTROSURGICAL) ×3
ELECTRODE REM PT RTRN 9FT ADLT (ELECTROSURGICAL) ×1 IMPLANT
FASTFIX NDL DEL SYS 360 CVD (Miscellaneous) ×12 IMPLANT
GAUZE SPONGE 4X4 12PLY STRL (GAUZE/BANDAGES/DRESSINGS) ×3 IMPLANT
GAUZE XEROFORM 1X8 LF (GAUZE/BANDAGES/DRESSINGS) ×2 IMPLANT
GLOVE BIO SURGEON STRL SZ8 (GLOVE) ×6 IMPLANT
GLOVE BIOGEL M 7.0 STRL (GLOVE) ×6 IMPLANT
GLOVE BIOGEL PI IND STRL 7.5 (GLOVE) ×1 IMPLANT
GLOVE BIOGEL PI INDICATOR 7.5 (GLOVE) ×2
GLOVE INDICATOR 8.0 STRL GRN (GLOVE) ×3 IMPLANT
GOWN STRL REUS W/ TWL LRG LVL3 (GOWN DISPOSABLE) ×1 IMPLANT
GOWN STRL REUS W/ TWL XL LVL3 (GOWN DISPOSABLE) ×2 IMPLANT
GOWN STRL REUS W/TWL LRG LVL3 (GOWN DISPOSABLE) ×3
GOWN STRL REUS W/TWL XL LVL3 (GOWN DISPOSABLE) ×6
IV LACTATED RINGER IRRG 3000ML (IV SOLUTION) ×3
IV LR IRRIG 3000ML ARTHROMATIC (IV SOLUTION) ×1 IMPLANT
KIT TURNOVER KIT A (KITS) ×3 IMPLANT
MANIFOLD NEPTUNE II (INSTRUMENTS) ×3 IMPLANT
NDL HYPO 21X1.5 SAFETY (NEEDLE) ×1 IMPLANT
NEEDLE HYPO 21X1.5 SAFETY (NEEDLE) ×3 IMPLANT
PACK ARTHROSCOPY KNEE (MISCELLANEOUS) ×3 IMPLANT
PAD CAST CTTN 4X4 STRL (SOFTGOODS) IMPLANT
PADDING CAST COTTON 4X4 STRL (SOFTGOODS) ×3
PENCIL ELECTRO HAND CTR (MISCELLANEOUS) ×3 IMPLANT
PUSHER KNOT ARTHRO STRT FASTFI (MISCELLANEOUS) ×2 IMPLANT
SUT PROLENE 4 0 PS 2 18 (SUTURE) ×3 IMPLANT
SUT TICRON COATED BLUE 2 0 30 (SUTURE) IMPLANT
SYR 50ML LL SCALE MARK (SYRINGE) ×3 IMPLANT
SYSTEM NDL DEL FSTFX  360 CVD (Miscellaneous) IMPLANT
TUBING ARTHRO INFLOW-ONLY STRL (TUBING) ×3 IMPLANT
WAND WEREWOLF FLOW 90D (MISCELLANEOUS) ×3 IMPLANT

## 2019-04-15 NOTE — Anesthesia Post-op Follow-up Note (Signed)
Anesthesia QCDR form completed.        

## 2019-04-15 NOTE — H&P (Signed)
Paper H&P to be scanned into permanent record. H&P reviewed and patient re-examined. No changes. 

## 2019-04-15 NOTE — Op Note (Signed)
04/15/2019  3:00 PM  Patient:   Kendra Wang  Pre-Op Diagnosis:   Complex medial meniscus tear with underlying degenerative joint disease, left knee.  Postoperative diagnosis:   Same  Procedure:   Arthroscopic medial meniscus repair with debridement and abrasion chondroplasty of grade 3 chondromalacial changes involving the medial femoral condyle and lateral tibial plateau, left knee.  Surgeon:   Pascal Lux, MD  Assistant:   Talitha Givens, PA-S  Anesthesia:   General LMA.  Findings:   As above.  There were grade 3-4 chondromalacial changes involving the patella and femoral trochlea, grade 3 chondromalacial changes involving the medial femoral condyle and lateral tibial plateau, and grade 2-3 chondromalacial changes involving the medial femoral condyle.  The lateral meniscus was in satisfactory condition, as were the anterior and posterior cruciate ligaments.  Complications:   None.  EBL:   2 cc.  Total fluids:   1100 cc of crystalloid.  Tourniquet time:   None  Drains:   None  Closure:   4-0 Prolene interrupted sutures.  Brief clinical note:   The patient is a 53 year old female with a 3 month history of increased medial sided left knee pain following an injury while descending some stairs. The patient was being treated for more chronic knee symptoms secondary to degenerative joint disease prior to the the recent flareup. Her symptom exacerbation did not respond to a steroid injection and a course of oral steroids, so an MRI scan was obtained which demonstrated a complex tear of the posterior portion of the medial meniscus in addition to the underlying degenerative joint disease. The patient presents at this time for arthroscopy, debridement, and repair versus partial medial meniscectomy.  Procedure:   The patient was brought into the operating room and lain in the supine position. After adequate general laryngeal mask anesthesia was obtained, a timeout was performed to verify  the appropriate side. The patient's left knee was injected sterilely using a solution of 30 cc of 1% lidocaine and 30 cc of 0.5% Sensorcaine with epinephrine. The left lower extremity was prepped with ChloraPrep solution before being draped sterilely. Preoperative antibiotics were administered. The expected portal sites were injected with 0.5% Sensorcaine with epinephrine before the camera was placed in the anterolateral portal and instrumentation performed through the anteromedial portal.   The knee was sequentially examined beginning in the suprapatellar pouch, then progressing to the patellofemoral space, the medial gutter and compartment, the notch, and finally the lateral compartment and gutter. The findings were as described above. Abundant reactive synovial tissues anteriorly were debrided using the full-radius resector in order to improve visualization. The frayed portions of the complex tear involving the posterior medial portion of the medial meniscus were debrided back to stable margins using the full-radius resector. There wre radial, peripheral, and horizontal components to the tear. The peripheral portion of the tear was stabilized using several [employer] FasT-Fix 360s. Subsequent probing of this repair demonstrated good stability. Areas of grade III chondromalacia involving the weightbearing portion of the medial femoral condyle and weightbearing portion of the lateral tibial plateau both were debrided back to stable margins using the full-radius resector before being lightly "annealed" using the ArthroCare device set at a low setting. The instruments were removed from the joint after suctioning the excess fluid.   The portal sites were closed using 4-0 Prolene interrupted sutures before a sterile bulky dressing was applied to the knee. The patient was then awakened, extubated, and returned to the recovery room in satisfactory  condition after tolerating the procedure well.

## 2019-04-15 NOTE — Progress Notes (Signed)
zofran given for nausea  

## 2019-04-15 NOTE — Progress Notes (Signed)
Pharmacy consult for Cefazolin:  53 yo F  Wt 107 kg  Will order Cefazolin 2 gm IV oncall for surgery  Chinita Greenland PharmD Clinical Pharmacist 04/15/2019

## 2019-04-15 NOTE — Anesthesia Procedure Notes (Signed)
Procedure Name: Intubation Date/Time: 04/15/2019 1:24 PM Performed by: Bernardo Heater, CRNA Pre-anesthesia Checklist: Patient identified, Patient being monitored, Timeout performed, Emergency Drugs available and Suction available Patient Re-evaluated:Patient Re-evaluated prior to induction Oxygen Delivery Method: Circle system utilized Preoxygenation: Pre-oxygenation with 100% oxygen Induction Type: IV induction Laryngoscope Size: Mac, 3 and 4 Grade View: Grade I Tube type: Oral Tube size: 7.0 mm Number of attempts: 1 Airway Equipment and Method: Stylet Placement Confirmation: ETT inserted through vocal cords under direct vision,  positive ETCO2 and breath sounds checked- equal and bilateral Secured at: 21 cm Tube secured with: Tape Dental Injury: Teeth and Oropharynx as per pre-operative assessment

## 2019-04-15 NOTE — Transfer of Care (Signed)
Immediate Anesthesia Transfer of Care Note  Patient: Kendra Wang  Procedure(s) Performed: KNEE ARTHROSCOPY WITH ABRASION  CHONDRALPLASTY AND  REPAIR  MEDIAL MENISCUS (Left Knee)  Patient Location: PACU  Anesthesia Type:General  Level of Consciousness: awake, alert  and oriented  Airway & Oxygen Therapy: Patient Spontanous Breathing and Patient connected to face mask oxygen  Post-op Assessment: Report given to RN and Post -op Vital signs reviewed and stable  Post vital signs: Reviewed and stable  Last Vitals:  Vitals Value Taken Time  BP    Temp    Pulse 87 04/15/19 1502  Resp 16 04/15/19 1502  SpO2 100 % 04/15/19 1502  Vitals shown include unvalidated device data.  Last Pain:  Vitals:   04/15/19 1205  TempSrc: Oral  PainSc: 9       Patients Stated Pain Goal: 3 ([id number])  Complications: No apparent anesthesia complications
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
## 2019-04-15 NOTE — Discharge Instructions (Addendum)
AMBULATORY SURGERY  DISCHARGE INSTRUCTIONS   1) The drugs that you were given will stay in your system until tomorrow so for the next 24 hours you should not:  A) Drive an automobile B) Make any legal decisions C) Drink any alcoholic beverage   2) You may resume regular meals tomorrow.  Today it is better to start with liquids and gradually work up to solid foods.  You may eat anything you prefer, but it is better to start with liquids, then soup and crackers, and gradually work up to solid foods.   3) Please notify your doctor immediately if you have any unusual bleeding, trouble breathing, redness and pain at the surgery site, drainage, fever, or pain not relieved by medication.    4) Additional Instructions:        Please contact your physician with any problems or Same Day Surgery at [phone], Monday through Friday 6 am to 4 pm, or  at [facility] number at [phone].    Orthopedic discharge instructions: Keep dressing dry and intact.  May shower after dressing changed on post-op day #4 (Saturday).  Cover sutures with Band-Aids after drying off. Apply ice frequently to knee. Take Mobic 7.5 mg daily OR ibuprofen 600-800 mg TID with meals for 7-10 days, then as necessary. Take pain medication as prescribed or ES Tylenol when needed.  May weight-bear as tolerated - use crutches or walker as needed. Follow-up in 10-14 days or as scheduled.    DR POGGI ORDERED NORCO ONLY, NOT OXYCODONE

## 2019-04-16 ENCOUNTER — Encounter: Payer: Self-pay | Admitting: Surgery

## 2019-04-16 NOTE — Anesthesia Postprocedure Evaluation (Signed)
Anesthesia Post Note  Patient: Kendra Wang  Procedure(s) Performed: KNEE ARTHROSCOPY WITH ABRASION  CHONDRALPLASTY AND  REPAIR  MEDIAL MENISCUS (Left Knee)  Patient location during evaluation: PACU Anesthesia Type: General Level of consciousness: awake and alert Pain management: pain level controlled Vital Signs Assessment: post-procedure vital signs reviewed and stable Respiratory status: spontaneous breathing and respiratory function stable Cardiovascular status: stable Anesthetic complications: no     Last Vitals:  Vitals:   04/15/19 1608 04/15/19 1625  BP: 125/79 (!) 158/83  Pulse: 89 98  Resp: 15 16  Temp:  (!) 36.2 C  SpO2: 97% 100%    Last Pain:  Vitals:   04/15/19 1625  TempSrc: Temporal  PainSc: 3                  KEPHART,WILLIAM K

## 2019-04-17 ENCOUNTER — Encounter: Payer: Self-pay | Admitting: Surgery

## 2019-05-12 ENCOUNTER — Other Ambulatory Visit: Payer: Self-pay

## 2019-05-12 MED ORDER — AMLODIPINE BESYLATE 5 MG PO TABS: 5.0000 mg | ORAL_TABLET | ORAL | 0 refills | Status: DC

## 2019-05-12 NOTE — Telephone Encounter (Signed)
Last ov 09/24/17 Last fill 03/03/19 #90/0 Pt need a follow up visit before any other refills.

## 2019-06-17 ENCOUNTER — Other Ambulatory Visit: Payer: Self-pay | Admitting: Family Medicine

## 2019-06-17 NOTE — Telephone Encounter (Signed)
Left pt vm for f/u visit.

## 2019-07-08 ENCOUNTER — Other Ambulatory Visit: Payer: Self-pay
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
## 2019-07-09 ENCOUNTER — Encounter: Payer: Self-pay | Admitting: Family Medicine

## 2019-07-09 ENCOUNTER — Ambulatory Visit (INDEPENDENT_AMBULATORY_CARE_PROVIDER_SITE_OTHER): Payer: BC Managed Care – PPO | Admitting: Family Medicine

## 2019-07-09 VITALS — BP 122/76 | HR 92 | Temp 99.3°F | Wt 230.2 lb

## 2019-07-09 DIAGNOSIS — I10 Essential (primary) hypertension: Secondary | ICD-10-CM | POA: Diagnosis not present

## 2019-07-09 DIAGNOSIS — E785 Hyperlipidemia, unspecified: Secondary | ICD-10-CM | POA: Diagnosis not present

## 2019-07-09 DIAGNOSIS — Z1231 Encounter for screening mammogram for malignant neoplasm of breast: Secondary | ICD-10-CM

## 2019-07-09 DIAGNOSIS — Z01419 Encounter for gynecological examination (general) (routine) without abnormal findings: Secondary | ICD-10-CM | POA: Diagnosis not present

## 2019-07-09 DIAGNOSIS — E559 Vitamin D deficiency, unspecified: Secondary | ICD-10-CM | POA: Diagnosis not present

## 2019-07-09 LAB — COMPREHENSIVE METABOLIC PANEL
ALT: 12 U/L (ref 0–35)
AST: 15 U/L (ref 0–37)
Albumin: 4 g/dL (ref 3.5–5.2)
Alkaline Phosphatase: 101 U/L (ref 39–117)
BUN: 6 mg/dL (ref 6–23)
CO2: 31 mEq/L (ref 19–32)
Calcium: 9.7 mg/dL (ref 8.4–10.5)
Chloride: 104 mEq/L (ref 96–112)
Creatinine, Ser: 0.73 mg/dL (ref 0.40–1.20)
GFR: 100.52 mL/min (ref >60.00)
Glucose, Bld: 91 mg/dL (ref 70–99)
Potassium: 4.9 mEq/L (ref 3.5–5.1)
Sodium: 140 mEq/L (ref 135–145)
Total Bilirubin: 0.3 mg/dL (ref 0.2–1.2)
Total Protein: 6.9 g/dL (ref 6.0–8.3)

## 2019-07-09 LAB — CBC WITH DIFFERENTIAL/PLATELET
Basophils Absolute: 0.1 10*3/uL (ref 0.0–0.1)
Basophils Relative: 1 % (ref 0.0–3.0)
Eosinophils Absolute: 0.1 10*3/uL (ref 0.0–0.7)
Eosinophils Relative: 1.1 % (ref 0.0–5.0)
HCT: 42.2 % (ref 36.0–46.0)
Hemoglobin: 13.8 g/dL (ref 12.0–15.0)
Lymphocytes Relative: 51.9 % — ABNORMAL HIGH (ref 12.0–46.0)
Lymphs Abs: 3.3 10*3/uL (ref 0.7–4.0)
MCHC: 32.6 g/dL (ref 30.0–36.0)
MCV: 91 fl (ref 78.0–100.0)
Monocytes Absolute: 0.6 10*3/uL (ref 0.1–1.0)
Monocytes Relative: 10.2 % (ref 3.0–12.0)
Neutro Abs: 2.3 10*3/uL (ref 1.4–7.7)
Neutrophils Relative %: 35.8 % — ABNORMAL LOW (ref 43.0–77.0)
Platelets: 344 10*3/uL (ref 150.0–400.0)
RBC: 4.64 Mil/uL (ref 3.87–5.11)
RDW: 14.2 % (ref 11.5–15.5)
WBC: 6.3 10*3/uL (ref 4.0–10.5)

## 2019-07-09 LAB — TSH: TSH: 1.08 u[IU]/mL (ref 0.35–4.50)

## 2019-07-09 LAB — LIPID PANEL
Cholesterol: 282 mg/dL — ABNORMAL HIGH (ref 0–200)
HDL: 49.6 mg/dL (ref >39.00)
NonHDL: 232.37
Total CHOL/HDL Ratio: 6
Triglycerides: 289 mg/dL — ABNORMAL HIGH (ref 0.0–149.0)
VLDL: 57.8 mg/dL — ABNORMAL HIGH (ref 0.0–40.0)

## 2019-07-09 LAB — VITAMIN D 25 HYDROXY (VIT D DEFICIENCY, FRACTURES): VITD: 13.76 ng/mL — ABNORMAL LOW (ref 30.00–100.00)

## 2019-07-09 LAB — LDL CHOLESTEROL, DIRECT: Direct LDL: 145 mg/dL

## 2019-07-09 MED ORDER — AMLODIPINE BESYLATE 5 MG PO TABS: ORAL_TABLET | ORAL | 3 refills | Status: DC

## 2019-07-09 NOTE — Assessment & Plan Note (Signed)
History: Review: taking medications as instructed, no medication side effects noted, no TIAs, no chest pain on exertion, no dyspnea on exertion, no swelling of ankles. Smoker: Yes.    BP Readings from Last 3 Encounters:  04/15/19 (!) 158/83  04/09/19 137/85  05/07/18 (!) 152/91   Lab Results  Component Value Date   CREATININE 0.80 12/27/2018     Assessment/Plan: 1. Medication: continue norvasc 5 mg daily.. 2. Dietary sodium restriction. 3. Regular aerobic exercise.

## 2019-07-09 NOTE — Assessment & Plan Note (Addendum)
Reviewed preventive care protocols, scheduled due services, and updated immunizations Discussed nutrition, exercise, diet, and healthy lifestyle.  Pap smear performed today.  Reviewed options: re: colon cancer screening, including cologuard vs colonoscopy.  Risks and benefits of both were discussed and patient voiced understanding.  Patient elects: cologuard.  Mammogram ordered and phone number for breast center given to pt to schedule her own mammogram.

## 2019-07-09 NOTE — Patient Instructions (Addendum)
Happy birthday!  Great to see you. I will call you with your lab results from today and you can view them online.   We are sending cologuard to your home.  Please call the [facility] at [phone] to schedule your mammogram.

## 2019-07-09 NOTE — Assessment & Plan Note (Addendum)
We discussed the pros and cons of lipid lowering medication based on current numbers and potential for cardiovascular risk. Decided to hold off on using medication at this time. Patient to work on diet and exercise. Offered nutrition counseling.   Agreed to check labs today.  The 10-year ASCVD risk score Kendra Wang DC Brooke Bonito., et al., 2013) is: 14.6%   Values used to calculate the score:     Age: 54 years     Sex: Female     Is Non-Hispanic African American: Yes     Diabetic: No     Tobacco smoker: Yes     Systolic Blood Pressure: [id number] mmHg     Is BP treated: Yes     HDL Cholesterol: 50 mg/dL     Total Cholesterol: 221 mg/dL

## 2019-07-10 ENCOUNTER — Telehealth: Payer: Self-pay | Admitting: Family Medicine

## 2019-07-10 ENCOUNTER — Other Ambulatory Visit: Payer: Self-pay

## 2019-07-10 DIAGNOSIS — E785 Hyperlipidemia, unspecified: Secondary | ICD-10-CM

## 2019-07-10 DIAGNOSIS — I10 Essential (primary) hypertension: Secondary | ICD-10-CM

## 2019-07-10 DIAGNOSIS — E559 Vitamin D deficiency, unspecified: Secondary | ICD-10-CM

## 2019-07-10 MED ORDER — VITAMIN D (ERGOCALCIFEROL) 1.25 MG (50000 UNIT) PO CAPS: 50000.0000 [IU] | ORAL_CAPSULE | ORAL | 0 refills | Status: DC

## 2019-07-10 MED ORDER — OMEPRAZOLE 20 MG PO CPDR: 20.0000 mg | DELAYED_RELEASE_CAPSULE | Freq: Every day | ORAL | 11 refills | Status: DC

## 2019-07-10 MED ORDER — MONTELUKAST SODIUM 10 MG PO TABS: 10.0000 mg | ORAL_TABLET | Freq: Every day | ORAL | 11 refills | Status: DC

## 2019-07-10 NOTE — Telephone Encounter (Signed)
Yes okay to refill as requested.  Thank you.

## 2019-07-10 NOTE — Addendum Note (Signed)
Addended by: Marrion Coy on: 07/10/2019 10:56 AM   Modules accepted: Orders

## 2019-07-10 NOTE — Telephone Encounter (Signed)
Completed per TA/thx dmf

## 2019-07-10 NOTE — Telephone Encounter (Signed)
TA-Plz advise if it is ok to send in Prilosec and Singulair/thx dmf

## 2019-07-10 NOTE — Progress Notes (Signed)
TA-Pt aware of lab results/she is in agreement with trying a Statin for her Cholesterol/plz advise what you would like her to try and I can send it in/thx dmf

## 2019-07-10 NOTE — Telephone Encounter (Signed)
Patient is requesting a refill for Prilosec and Singular. Please call patient at [phone] when RX has been sent in.

## 2019-07-11 MED ORDER — ROSUVASTATIN CALCIUM 5 MG PO TABS: 5.0000 mg | ORAL_TABLET | Freq: Every day | ORAL | 3 refills | Status: DC

## 2019-07-11 NOTE — Addendum Note (Signed)
Addended by: Marrion Coy on: 07/11/2019 10:01 AM   Modules accepted: Orders

## 2019-07-11 NOTE — Progress Notes (Signed)
Noted. eRx sent in for crestor 5 mg nightly.  Please schedule her for follow up lipid panel, [facility] in 4-8 weeks (diagnosis hyperlipidemia) or when she establishes with new provider . Thank you.

## 2019-07-21 ENCOUNTER — Other Ambulatory Visit: Payer: Self-pay | Admitting: Surgery

## 2019-07-21 DIAGNOSIS — M47816 Spondylosis without myelopathy or radiculopathy, lumbar region: Secondary | ICD-10-CM

## 2019-07-21 DIAGNOSIS — M4316 Spondylolisthesis, lumbar region: Secondary | ICD-10-CM

## 2019-07-22 ENCOUNTER — Other Ambulatory Visit: Payer: Self-pay

## 2019-07-22 MED ORDER — NYSTATIN 100000 UNIT/GM EX POWD: CUTANEOUS | 5 refills | Status: DC

## 2019-07-31 ENCOUNTER — Other Ambulatory Visit: Payer: Self-pay

## 2019-07-31 ENCOUNTER — Ambulatory Visit
Admission: RE | Admit: 2019-07-31 | Discharge: 2019-07-31 | Disposition: A | Discharge status: Home / Self Care | Payer: BC Managed Care – PPO | Source: Ambulatory Visit | Attending: Surgery | Admitting: Surgery

## 2019-07-31 DIAGNOSIS — M47816 Spondylosis without myelopathy or radiculopathy, lumbar region: Secondary | ICD-10-CM | POA: Insufficient documentation

## 2019-07-31 DIAGNOSIS — M4316 Spondylolisthesis, lumbar region: Secondary | ICD-10-CM | POA: Diagnosis present

## 2019-08-19 ENCOUNTER — Encounter: Payer: Self-pay | Admitting: Student in an Organized Health Care Education/Training Program

## 2019-08-19 NOTE — Progress Notes (Signed)
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 Wang's risk profile.  Procedure(s) under consideration:  Bilateral L3, L4, L5 facet medial branch nerve block possible RFA Left knee genicular nerve block possible RFA   Provider-requested follow-up: Return in about 6 weeks (around 10/01/2019) for in person, Medication Management (M/W afternoon ok).  Future Appointments  Date Time Provider [employer]  10/15/2019 10:00 AM Lesleigh Noe, MD [facility]   Total duration of encounter: 45 minutes.  Primary Care Physician: System, Provider Not In Note by: Kendra Santa, MD Date: 08/20/2019; Time: 2:32 PM

## 2019-08-20 ENCOUNTER — Encounter: Payer: Self-pay | Admitting: Student in an Organized Health Care Education/Training Program

## 2019-08-20 ENCOUNTER — Ambulatory Visit
Payer: BC Managed Care – PPO | Attending: Student in an Organized Health Care Education/Training Program | Admitting: Student in an Organized Health Care Education/Training Program

## 2019-08-20 ENCOUNTER — Other Ambulatory Visit: Payer: Self-pay

## 2019-08-20 DIAGNOSIS — G894 Chronic pain syndrome: Secondary | ICD-10-CM | POA: Diagnosis not present

## 2019-08-20 DIAGNOSIS — M170 Bilateral primary osteoarthritis of knee: Secondary | ICD-10-CM | POA: Diagnosis not present

## 2019-08-20 DIAGNOSIS — Z6841 Body Mass Index (BMI) 40.0 and over, adult: Secondary | ICD-10-CM

## 2019-08-20 DIAGNOSIS — S83232S Complex tear of medial meniscus, current injury, left knee, sequela: Secondary | ICD-10-CM | POA: Diagnosis not present

## 2019-08-20 DIAGNOSIS — M47816 Spondylosis without myelopathy or radiculopathy, lumbar region: Secondary | ICD-10-CM

## 2019-08-20 MED ORDER — DICLOFENAC SODIUM 75 MG PO TBEC: 75.0000 mg | DELAYED_RELEASE_TABLET | Freq: Two times a day (BID) | ORAL | 1 refills | Status: DC

## 2019-08-20 MED ORDER — TIZANIDINE HCL 4 MG PO TABS: 4.0000 mg | ORAL_TABLET | Freq: Every evening | ORAL | 1 refills | Status: AC | PRN

## 2019-08-20 MED ORDER — GABAPENTIN 100 MG PO CAPS: ORAL_CAPSULE | ORAL | 0 refills | Status: DC

## 2019-09-01 ENCOUNTER — Encounter: Payer: BC Managed Care – PPO | Admitting: Family Medicine

## 2019-09-11 ENCOUNTER — Telehealth: Payer: Self-pay | Admitting: Family Medicine

## 2019-09-11 NOTE — Telephone Encounter (Signed)
Opened in error

## 2019-09-13 ENCOUNTER — Other Ambulatory Visit: Payer: Self-pay | Admitting: Student in an Organized Health Care Education/Training Program

## 2019-09-13 DIAGNOSIS — G894 Chronic pain syndrome: Secondary | ICD-10-CM

## 2019-09-15 ENCOUNTER — Encounter: Payer: Self-pay | Admitting: Nurse Practitioner

## 2019-09-15 ENCOUNTER — Other Ambulatory Visit: Payer: Self-pay

## 2019-09-15 ENCOUNTER — Ambulatory Visit: Payer: BC Managed Care – PPO | Attending: Nurse Practitioner | Admitting: Nurse Practitioner

## 2019-09-15 DIAGNOSIS — G894 Chronic pain syndrome: Secondary | ICD-10-CM | POA: Diagnosis not present

## 2019-09-15 DIAGNOSIS — Z6841 Body Mass Index (BMI) 40.0 and over, adult: Secondary | ICD-10-CM

## 2019-09-15 DIAGNOSIS — M170 Bilateral primary osteoarthritis of knee: Secondary | ICD-10-CM | POA: Diagnosis not present

## 2019-09-15 DIAGNOSIS — E669 Obesity, unspecified: Secondary | ICD-10-CM

## 2019-09-15 DIAGNOSIS — I10 Essential (primary) hypertension: Secondary | ICD-10-CM | POA: Diagnosis not present

## 2019-09-15 DIAGNOSIS — Z7689 Persons encountering health services in other specified circumstances: Secondary | ICD-10-CM
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
## 2019-09-17 ENCOUNTER — Telehealth: Payer: Self-pay

## 2019-09-17 NOTE — Telephone Encounter (Signed)
Telephoned patient at home number. Left a voice message with [facility] contact information. 

## 2019-09-18 ENCOUNTER — Other Ambulatory Visit: Payer: Self-pay | Admitting: Student in an Organized Health Care Education/Training Program

## 2019-09-18 DIAGNOSIS — G894 Chronic pain syndrome: Secondary | ICD-10-CM

## 2019-10-01 ENCOUNTER — Ambulatory Visit: Payer: BC Managed Care – PPO | Admitting: Student in an Organized Health Care Education/Training Program

## 2019-10-13 ENCOUNTER — Ambulatory Visit: Payer: BC Managed Care – PPO | Admitting: Pain Medicine

## 2019-10-14 ENCOUNTER — Telehealth: Payer: Self-pay | Admitting: Nurse Practitioner

## 2019-10-14 NOTE — Telephone Encounter (Signed)
Patient called requesting to speak with a financial counselor because she has some questions regarding the application. Please f/u

## 2019-10-15 ENCOUNTER — Encounter: Payer: BC Managed Care – PPO | Admitting: Family Medicine

## 2019-10-15 NOTE — Telephone Encounter (Signed)
Returned call and LVM informing patient that if she still has questions/concerns about our application process/application requirements to give me a call back.

## 2019-11-10 ENCOUNTER — Ambulatory Visit: Payer: BC Managed Care – PPO | Attending: Nurse Practitioner | Admitting: Nurse Practitioner

## 2019-11-10 ENCOUNTER — Other Ambulatory Visit: Payer: Self-pay

## 2019-11-10 ENCOUNTER — Encounter: Payer: Self-pay | Admitting: Nurse Practitioner

## 2019-11-10 VITALS — BP 139/89 | HR 83 | Temp 97.7°F | Ht 60.0 in | Wt 230.0 lb

## 2019-11-10 DIAGNOSIS — M1712 Unilateral primary osteoarthritis, left knee: Secondary | ICD-10-CM

## 2019-11-10 DIAGNOSIS — G894 Chronic pain syndrome: Secondary | ICD-10-CM

## 2019-11-10 DIAGNOSIS — Z1211 Encounter for screening for malignant neoplasm of colon: Secondary | ICD-10-CM

## 2019-11-10 DIAGNOSIS — I10 Essential (primary) hypertension: Secondary | ICD-10-CM | POA: Diagnosis not present

## 2019-11-10 MED ORDER — GABAPENTIN 300 MG PO CAPS: 300.0000 mg | ORAL_CAPSULE | Freq: Three times a day (TID) | ORAL | 2 refills | Status: DC

## 2019-11-10 MED ORDER — TRAMADOL HCL 50 MG PO TABS: 50.0000 mg | ORAL_TABLET | Freq: Four times a day (QID) | ORAL | 0 refills | Status: DC | PRN

## 2019-11-10 MED FILL — traMADol HCL 50 MG TABS: 50 | 15 days supply | Qty: 60 | Fill #0

## 2019-11-10 MED FILL — GABAPENTIN 300 MG CAPSULE: 300 | 30 days supply | Qty: 90 | Fill #0
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
## 2019-11-18 ENCOUNTER — Ambulatory Visit: Payer: BC Managed Care – PPO

## 2019-11-28 ENCOUNTER — Other Ambulatory Visit: Payer: Self-pay | Admitting: Neurosurgery

## 2019-11-28 ENCOUNTER — Other Ambulatory Visit: Payer: Self-pay | Admitting: *Deleted

## 2019-11-28 DIAGNOSIS — D329 Benign neoplasm of meninges, unspecified: Secondary | ICD-10-CM

## 2019-11-28 DIAGNOSIS — Z1231 Encounter for screening mammogram for malignant neoplasm of breast: Secondary | ICD-10-CM

## 2019-12-02 LAB — FECAL OCCULT BLOOD, IMMUNOCHEMICAL: Fecal Occult Bld: NEGATIVE

## 2019-12-15 ENCOUNTER — Other Ambulatory Visit: Payer: Self-pay | Admitting: Nurse Practitioner

## 2019-12-15 DIAGNOSIS — M1712 Unilateral primary osteoarthritis, left knee: Secondary | ICD-10-CM

## 2019-12-15 DIAGNOSIS — G894 Chronic pain syndrome: Secondary | ICD-10-CM

## 2019-12-15 MED FILL — GABAPENTIN 300 MG CAPSULE: 300 | 30 days supply | Qty: 90 | Fill #1

## 2019-12-16 ENCOUNTER — Ambulatory Visit: Payer: BC Managed Care – PPO | Admitting: Nurse Practitioner

## 2019-12-16 MED FILL — tiZANidine HCL 4 MG TABS: 4 | 30 days supply | Qty: 30 | Fill #0

## 2019-12-16 MED FILL — NYSTATIN 100000 UNIT/GM POW: 100000 | 15 days supply | Qty: 60 | Fill #0

## 2019-12-16 MED FILL — ROSUVASTATIN CALCIUM 5 MG T: 5 | 30 days supply | Qty: 30 | Fill #0

## 2019-12-16 MED FILL — MONTELUKAST SOD 10 MG TAB: 10 | 30 days supply | Qty: 30 | Fill #0

## 2019-12-17 ENCOUNTER — Other Ambulatory Visit: Payer: Self-pay | Admitting: Nurse Practitioner

## 2019-12-17 DIAGNOSIS — M1712 Unilateral primary osteoarthritis, left knee: Secondary | ICD-10-CM

## 2019-12-17 MED FILL — ?AMLODIPINE BESYLATE 5MG TA: 5 | 30 days supply | Qty: 30 | Fill #0

## 2019-12-17 MED FILL — ?OMEPRAZOLE 20 MG CPDR: 20 | 30 days supply | Qty: 30 | Fill #0

## 2019-12-17 MED FILL — traMADol HCL 50 MG TABS: 50 | 15 days supply | Qty: 60 | Fill #0

## 2019-12-25 ENCOUNTER — Ambulatory Visit: Payer: Self-pay

## 2020-01-07 ENCOUNTER — Emergency Department
Admission: EM | Admit: 2020-01-07 | Discharge: 2020-01-07 | Disposition: A | Discharge status: Home / Self Care | Payer: Self-pay | Attending: Emergency Medicine | Admitting: Emergency Medicine

## 2020-01-07 ENCOUNTER — Other Ambulatory Visit: Payer: Self-pay

## 2020-01-07 ENCOUNTER — Emergency Department: Payer: Self-pay

## 2020-01-07 ENCOUNTER — Encounter: Payer: Self-pay | Admitting: *Deleted

## 2020-01-07 DIAGNOSIS — Z87891 Personal history of nicotine dependence: Secondary | ICD-10-CM | POA: Insufficient documentation

## 2020-01-07 DIAGNOSIS — M545 Low back pain, unspecified: Secondary | ICD-10-CM

## 2020-01-07 DIAGNOSIS — I2510 Atherosclerotic heart disease of native coronary artery without angina pectoris: Secondary | ICD-10-CM | POA: Insufficient documentation

## 2020-01-07 DIAGNOSIS — R350 Frequency of micturition: Secondary | ICD-10-CM | POA: Insufficient documentation

## 2020-01-07 DIAGNOSIS — I10 Essential (primary) hypertension: Secondary | ICD-10-CM | POA: Insufficient documentation

## 2020-01-07 DIAGNOSIS — Z79899 Other long term (current) drug therapy: Secondary | ICD-10-CM | POA: Insufficient documentation

## 2020-01-07 LAB — URINALYSIS, COMPLETE (UACMP) WITH MICROSCOPIC
Bacteria, UA: NONE SEEN
Bilirubin Urine: NEGATIVE
Glucose, UA: NEGATIVE mg/dL
Hgb urine dipstick: NEGATIVE
Ketones, ur: NEGATIVE mg/dL
Leukocytes,Ua: NEGATIVE
Nitrite: NEGATIVE
Protein, ur: NEGATIVE mg/dL
Specific Gravity, Urine: 1.017 (ref 1.005–1.030)
pH: 6 (ref 5.0–8.0)

## 2020-01-07 MED ORDER — NAPROXEN 500 MG PO TABS
500.0000 mg | ORAL_TABLET | Freq: Once | ORAL | Status: AC
  Administered 2020-01-07: 500 mg via ORAL
  Filled 2020-01-07: qty 1

## 2020-01-07 MED ORDER — NAPROXEN 500 MG PO TABS
500.0000 mg | ORAL_TABLET | Freq: Two times a day (BID) | ORAL | 2 refills | Status: DC
Start: 2020-01-07 — End: 2020-03-30

## 2020-01-07 NOTE — ED Notes (Signed)
Hourly rounding reveals patient in room sitting on bed. No complaints, stable, in no acute distress. Q15 minute rounds and monitoring to continue.

## 2020-01-07 NOTE — ED Triage Notes (Addendum)
Pt to ED reporting right sided flank pain x 2 days with increased urination. No fevers reported.

## 2020-01-07 NOTE — ED Notes (Signed)
Patient taken to CT for renal stone study

## 2020-01-07 NOTE — ED Notes (Signed)
Patient discharged home, patient received discharge papers and prescription for Naproxen. Patient appropriate and cooperative. Vital signs taken. NAD noted.
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
## 2020-01-08 LAB — URINE CULTURE: Culture: <10000 — AB

## 2020-01-19 ENCOUNTER — Telehealth: Payer: Self-pay | Admitting: Nurse Practitioner

## 2020-01-19 NOTE — Telephone Encounter (Signed)
Patient called, left VM to return the call to the office to let us know the specific medication she will need refills on or call the pharmacy to request the refill.

## 2020-01-19 NOTE — Telephone Encounter (Signed)
Medication Refill - Medication: all available refillable meds  Has the patient contacted their pharmacy? Yes.   (Agent: If no, request that the patient contact the pharmacy for the refill.) (Agent: If yes, when and what did the pharmacy advise?)  Preferred Pharmacy (with phone number or street name): [place], [place]: Please be advised that RX refills may take up to 3 business days. We ask that you follow-up with your pharmacy.

## 2020-01-20 ENCOUNTER — Other Ambulatory Visit: Payer: Self-pay | Admitting: Student in an Organized Health Care Education/Training Program

## 2020-01-20 ENCOUNTER — Other Ambulatory Visit: Payer: Self-pay | Admitting: Nurse Practitioner

## 2020-01-20 DIAGNOSIS — M1712 Unilateral primary osteoarthritis, left knee: Secondary | ICD-10-CM

## 2020-01-20 DIAGNOSIS — G894 Chronic pain syndrome: Secondary | ICD-10-CM

## 2020-01-20 MED FILL — NYSTATIN 100000 UNIT/GM POW: 100000 | 15 days supply | Qty: 60 | Fill #1

## 2020-01-20 MED FILL — ?OMEPRAZOLE 20 MG CPDR: 20 | 30 days supply | Qty: 30 | Fill #1

## 2020-01-20 MED FILL — GABAPENTIN 300 MG CAPSULE: 300 | 30 days supply | Qty: 90 | Fill #2

## 2020-01-20 MED FILL — MONTELUKAST SOD 10 MG TAB: 10 | 30 days supply | Qty: 30 | Fill #1

## 2020-01-20 MED FILL — ?AMLODIPINE BESYLATE 5MG TA: 5 | 30 days supply | Qty: 30 | Fill #1

## 2020-01-20 MED FILL — ?ROSUVASTATIN CALCIUM 5MG T: 5 | 30 days supply | Qty: 30 | Fill #1

## 2020-01-20 NOTE — Telephone Encounter (Signed)
Requested medication (s) are due for refill today:yes  Requested medication (s) are on the active medication list: No  Last refill:  12/15/19  #60 0 refills  Future visit scheduled: yes  Notes to clinic:  Medication not delegated. Expired med end date 01/16/20    Requested Prescriptions  Pending Prescriptions Disp Refills   traMADol (ULTRAM) 50 MG tablet [Pharmacy Med Name: traMADol HCL 50 MG TABS 50 Tablet] 60 tablet 0    Sig: TAKE 1 TABLET (50 MG TOTAL) BY MOUTH EVERY 6 (SIX) HOURS AS NEEDED FOR MODERATE PAIN OR SEVERE PAIN.      Not Delegated - Analgesics:  Opioid Agonists Failed - 01/20/2020  1:08 PM      Failed - This refill cannot be delegated      Failed - Urine Drug Screen completed in last 360 days.      Passed - Valid encounter within last 6 months    Recent Outpatient Visits           2 months ago Essential hypertension   [place], NP   4 months ago Encounter to establish care   [place], Vernia Buff, NP       Future Appointments             In 3 weeks Gildardo Pounds, NP [place]   In 6 months Einar Pheasant, Jobe Marker, MD [employer] [facility] [place], [place] [facility]

## 2020-01-21 ENCOUNTER — Ambulatory Visit (INDEPENDENT_AMBULATORY_CARE_PROVIDER_SITE_OTHER): Payer: Self-pay | Admitting: Orthopedic Surgery

## 2020-01-21 ENCOUNTER — Ambulatory Visit (INDEPENDENT_AMBULATORY_CARE_PROVIDER_SITE_OTHER): Payer: Self-pay

## 2020-01-21 ENCOUNTER — Ambulatory Visit: Payer: Self-pay

## 2020-01-21 ENCOUNTER — Other Ambulatory Visit: Payer: Self-pay | Admitting: Orthopedic Surgery

## 2020-01-21 ENCOUNTER — Encounter: Payer: Self-pay | Admitting: Orthopedic Surgery

## 2020-01-21 VITALS — Ht 62.0 in | Wt 238.2 lb

## 2020-01-21 DIAGNOSIS — G8929 Other chronic pain: Secondary | ICD-10-CM

## 2020-01-21 DIAGNOSIS — M25562 Pain in left knee: Secondary | ICD-10-CM

## 2020-01-21 DIAGNOSIS — M25561 Pain in right knee: Secondary | ICD-10-CM

## 2020-01-21 MED ORDER — METHOCARBAMOL 500 MG PO TABS
500.0000 mg | ORAL_TABLET | Freq: Three times a day (TID) | ORAL | 1 refills | Status: DC | PRN
Start: 2020-01-21 — End: 2021-01-18
  Filled 2020-11-23: qty 30, 10d supply, fill #0

## 2020-01-21 MED ORDER — MELOXICAM 15 MG PO TABS
15.0000 mg | ORAL_TABLET | Freq: Every day | ORAL | 2 refills | Status: DC
Start: 2020-01-21 — End: 2020-03-30

## 2020-01-21 MED FILL — MELOXICAM 15 MG TABLET: 15 | 30 days supply | Qty: 30 | Fill #0

## 2020-01-21 MED FILL — METHOCARBAMOL 500 MG TABS: 500 | 10 days supply | Qty: 30 | Fill #0
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
## 2020-01-22 ENCOUNTER — Ambulatory Visit: Payer: BC Managed Care – PPO | Admitting: Physician Assistant

## 2020-01-28 MED FILL — MELOXICAM 15 MG TABLET: 15 | 30 days supply | Qty: 30 | Fill #0

## 2020-01-28 MED FILL — METHOCARBAMOL 500 MG TABS: 500 | 10 days supply | Qty: 30 | Fill #0

## 2020-02-09 ENCOUNTER — Telehealth: Payer: Self-pay | Admitting: Orthopedic Surgery

## 2020-02-09 NOTE — Telephone Encounter (Signed)
IC s/w spoke with patient at length reminded her that she had previously had an MRI Feb of 2021 and likely insurance would not cover. Also did remind her of Dr Deans plan at her last office visit earlier this month. She will call us back with further questions or concerns. She also asked about taking naprosyn and mobic together and I advised her against this, she verbalized understanding.   Plan: Impression is low back pain with MRI scan from February 2021 which shows mild degenerative wear and tear in the lumbar spine but nothing really compressing on the nerve roots.  We talked about ESI but she was to hold off on any injection.  I do not think this represents any type of neuropathy in either foot.  Both knees also have fairly significant arthritis both in the patellofemoral and medial compartments.  For this we are to try Mobic and a muscle relaxer.  Hold off on Naprosyn for now.  Also wrote a prescription for therapy for quad strengthening exercises and core strengthening.  Next step for the knees and back would be injections.  See her back as needed.   Follow-Up Instructions: Return if symptoms worsen or fail to improve.

## 2020-02-09 NOTE — Telephone Encounter (Signed)
Patient called requesting Dr. Marlou Sa to send referral for MRI. Patient states to be still having sever back pains. Please call patient at [phone].

## 2020-02-10 ENCOUNTER — Encounter: Payer: Self-pay | Admitting: Nurse Practitioner

## 2020-02-10 ENCOUNTER — Ambulatory Visit: Payer: Self-pay | Attending: Nurse Practitioner | Admitting: Nurse Practitioner

## 2020-02-10 ENCOUNTER — Other Ambulatory Visit: Payer: Self-pay | Admitting: Nurse Practitioner

## 2020-02-10 DIAGNOSIS — Z87898 Personal history of other specified conditions: Secondary | ICD-10-CM

## 2020-02-10 DIAGNOSIS — M1712 Unilateral primary osteoarthritis, left knee: Secondary | ICD-10-CM

## 2020-02-10 DIAGNOSIS — Z8709 Personal history of other diseases of the respiratory system: Secondary | ICD-10-CM

## 2020-02-10 DIAGNOSIS — E7800 Pure hypercholesterolemia, unspecified: Secondary | ICD-10-CM

## 2020-02-10 DIAGNOSIS — G894 Chronic pain syndrome: Secondary | ICD-10-CM

## 2020-02-10 DIAGNOSIS — I10 Essential (primary) hypertension: Secondary | ICD-10-CM

## 2020-02-10 DIAGNOSIS — K219 Gastro-esophageal reflux disease without esophagitis: Secondary | ICD-10-CM

## 2020-02-10 MED ORDER — GABAPENTIN 300 MG PO CAPS: 300.0000 mg | ORAL_CAPSULE | Freq: Three times a day (TID) | ORAL | 2 refills | Status: DC

## 2020-02-10 MED ORDER — ROSUVASTATIN CALCIUM 5 MG PO TABS
5.0000 mg | ORAL_TABLET | Freq: Every day | ORAL | 3 refills | Status: DC
  Filled 2020-09-24: qty 30, 30d supply, fill #0
  Filled 2020-10-22: qty 30, 30d supply, fill #1
  Filled 2021-01-18: qty 30, 30d supply, fill #2

## 2020-02-10 MED ORDER — TRAMADOL HCL 50 MG PO TABS: 50.0000 mg | ORAL_TABLET | Freq: Two times a day (BID) | ORAL | 0 refills | Status: DC | PRN

## 2020-02-10 MED ORDER — OMEPRAZOLE 20 MG PO CPDR: 20.0000 mg | DELAYED_RELEASE_CAPSULE | Freq: Every day | ORAL | 11 refills | Status: DC

## 2020-02-10 MED ORDER — AMLODIPINE BESYLATE 5 MG PO TABS: ORAL_TABLET | ORAL | 3 refills | Status: DC

## 2020-02-10 MED ORDER — MONTELUKAST SODIUM 10 MG PO TABS: 10.0000 mg | ORAL_TABLET | Freq: Every day | ORAL | 11 refills | Status: DC
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
## 2020-02-11 MED FILL — traMADol HCL 50 MG TABS: 50 | 15 days supply | Qty: 30 | Fill #0

## 2020-02-16 MED FILL — MONTELUKAST SOD 10 MG TAB: 10 | 30 days supply | Qty: 30 | Fill #0

## 2020-02-16 MED FILL — AMLODIPINE BESYLATE 5 MG TA: 5 | 30 days supply | Qty: 30 | Fill #0

## 2020-02-16 MED FILL — ?OMEPRAZOLE 20 MG CPDR: 20 | 30 days supply | Qty: 30 | Fill #0

## 2020-02-16 MED FILL — ?ROSUVASTATIN CALCIUM 5MG T: 5 | 30 days supply | Qty: 30 | Fill #0

## 2020-02-16 MED FILL — GABAPENTIN 300 MG CAPSULE: 300 | 30 days supply | Qty: 90 | Fill #0

## 2020-02-18 ENCOUNTER — Other Ambulatory Visit: Payer: Self-pay | Admitting: Nurse Practitioner

## 2020-02-18 ENCOUNTER — Ambulatory Visit: Payer: BC Managed Care – PPO | Attending: Family Medicine

## 2020-02-18 ENCOUNTER — Other Ambulatory Visit: Payer: Self-pay

## 2020-02-18 DIAGNOSIS — E781 Pure hyperglyceridemia: Secondary | ICD-10-CM

## 2020-02-18 DIAGNOSIS — I10 Essential (primary) hypertension: Secondary | ICD-10-CM

## 2020-02-18 DIAGNOSIS — Z1159 Encounter for screening for other viral diseases: Secondary | ICD-10-CM

## 2020-02-18 DIAGNOSIS — E559 Vitamin D deficiency, unspecified: Secondary | ICD-10-CM

## 2020-02-18 DIAGNOSIS — D709 Neutropenia, unspecified: Secondary | ICD-10-CM

## 2020-02-18 MED FILL — traMADol HCL 50 MG TABS: 50 | 15 days supply | Qty: 30 | Fill #0

## 2020-02-19 LAB — CBC
Hematocrit: 41.7 % (ref 34.0–46.6)
Hemoglobin: 13.5 g/dL (ref 11.1–15.9)
MCH: 29.2 pg (ref 26.6–33.0)
MCHC: 32.4 g/dL (ref 31.5–35.7)
MCV: 90 fL (ref 79–97)
Platelets: 345 10*3/uL (ref 150–450)
RBC: 4.62 x10E6/uL (ref 3.77–5.28)
RDW: 13 % (ref 11.7–15.4)
WBC: 6.7 10*3/uL (ref 3.4–10.8)

## 2020-02-19 LAB — BASIC METABOLIC PANEL
BUN/Creatinine Ratio: 12 (ref 9–23)
BUN: 9 mg/dL (ref 6–24)
CO2: 28 mmol/L (ref 20–29)
Calcium: 9.5 mg/dL (ref 8.7–10.2)
Chloride: 101 mmol/L (ref 96–106)
Creatinine, Ser: 0.78 mg/dL (ref 0.57–1.00)
GFR calc Af Amer: 100 mL/min/{1.73_m2} (ref >59)
GFR calc non Af Amer: 86 mL/min/{1.73_m2} (ref >59)
Glucose: 85 mg/dL (ref 65–99)
Potassium: 4.7 mmol/L (ref 3.5–5.2)
Sodium: 141 mmol/L (ref 134–144)

## 2020-02-19 LAB — LIPID PANEL
Chol/HDL Ratio: 5.9 ratio — ABNORMAL HIGH (ref 0.0–4.4)
Cholesterol, Total: 267 mg/dL — ABNORMAL HIGH (ref 100–199)
HDL: 45 mg/dL (ref >39)
LDL Chol Calc (NIH): 175 mg/dL — ABNORMAL HIGH (ref 0–99)
Triglycerides: 249 mg/dL — ABNORMAL HIGH (ref 0–149)
VLDL Cholesterol Cal: 47 mg/dL — ABNORMAL HIGH (ref 5–40)

## 2020-02-19 LAB — HEPATITIS C ANTIBODY: Hep C Virus Ab: <0.1 s/co ratio (ref 0.0–0.9)

## 2020-02-19 LAB — VITAMIN D 25 HYDROXY (VIT D DEFICIENCY, FRACTURES): Vit D, 25-Hydroxy: 14.7 ng/mL — ABNORMAL LOW (ref 30.0–100.0)

## 2020-02-20 ENCOUNTER — Other Ambulatory Visit: Payer: Self-pay | Admitting: Nurse Practitioner

## 2020-02-20 MED ORDER — VITAMIN D (ERGOCALCIFEROL) 1.25 MG (50000 UNIT) PO CAPS: 50000.0000 [IU] | ORAL_CAPSULE | ORAL | 0 refills | Status: DC

## 2020-02-20 MED FILL — VIT D2 1.25 MG (50,000 UNIT: 1.25 MG | 84 days supply | Qty: 12 | Fill #0

## 2020-02-28 ENCOUNTER — Other Ambulatory Visit: Payer: Self-pay

## 2020-02-28 ENCOUNTER — Ambulatory Visit
Admission: RE | Admit: 2020-02-28 | Discharge: 2020-02-28 | Disposition: A | Discharge status: Home / Self Care | Payer: BC Managed Care – PPO | Source: Ambulatory Visit | Attending: Neurosurgery | Admitting: Neurosurgery

## 2020-02-28 DIAGNOSIS — D329 Benign neoplasm of meninges, unspecified: Secondary | ICD-10-CM | POA: Insufficient documentation

## 2020-02-28 MED ORDER — GADOBUTROL 1 MMOL/ML IV SOLN
10.0000 mL | Freq: Once | INTRAVENOUS | Status: AC | PRN
  Administered 2020-02-28: 10 mL via INTRAVENOUS

## 2020-03-02 ENCOUNTER — Other Ambulatory Visit: Payer: Self-pay | Admitting: Nurse Practitioner

## 2020-03-02 DIAGNOSIS — Z1231 Encounter for screening mammogram for malignant neoplasm of breast: Secondary | ICD-10-CM

## 2020-03-03 ENCOUNTER — Telehealth: Payer: Self-pay

## 2020-03-03 MED FILL — VIT D2 1.25 MG (50,000 UNIT: 1.25 MG | 84 days supply | Qty: 12 | Fill #0

## 2020-03-03 NOTE — Telephone Encounter (Signed)
Pt. Given results and instructions. Verbalizes understanding. 

## 2020-03-11 LAB — HM PAP SMEAR: HM Pap smear: NEGATIVE

## 2020-03-17 ENCOUNTER — Ambulatory Visit
Admission: RE | Admit: 2020-03-17 | Discharge: 2020-03-17 | Disposition: A | Discharge status: Home / Self Care | Payer: BC Managed Care – PPO | Source: Ambulatory Visit | Attending: Nurse Practitioner | Admitting: Nurse Practitioner

## 2020-03-17 DIAGNOSIS — Z1231 Encounter for screening mammogram for malignant neoplasm of breast: Secondary | ICD-10-CM | POA: Insufficient documentation

## 2020-03-23 MED FILL — OMEPRAZOLE 20 MG CAP: 20 | 30 days supply | Qty: 30 | Fill #1

## 2020-03-23 MED FILL — ?ROSUVASTATIN CALCIUM 5MG T: 5 | 30 days supply | Qty: 30 | Fill #1

## 2020-03-23 MED FILL — AMLODIPINE BESYLATE 5 MG TA: 5 | 30 days supply | Qty: 30 | Fill #1

## 2020-03-23 MED FILL — MONTELUKAST SOD 10 MG TAB: 10 | 30 days supply | Qty: 30 | Fill #1

## 2020-03-23 MED FILL — MELOXICAM 15 MG TABLET: 15 | 30 days supply | Qty: 30 | Fill #1

## 2020-03-23 MED FILL — GABAPENTIN 300 MG CAPSULE: 300 | 30 days supply | Qty: 90 | Fill #1

## 2020-03-30 ENCOUNTER — Ambulatory Visit: Payer: BC Managed Care – PPO | Attending: Nurse Practitioner | Admitting: Nurse Practitioner

## 2020-03-30 ENCOUNTER — Other Ambulatory Visit: Payer: Self-pay

## 2020-03-30 ENCOUNTER — Encounter: Payer: Self-pay | Admitting: Nurse Practitioner

## 2020-03-30 ENCOUNTER — Other Ambulatory Visit: Payer: Self-pay | Admitting: Nurse Practitioner

## 2020-03-30 VITALS — BP 157/84 | HR 75 | Temp 97.7°F | Ht 62.0 in | Wt 239.0 lb

## 2020-03-30 DIAGNOSIS — G894 Chronic pain syndrome: Secondary | ICD-10-CM

## 2020-03-30 DIAGNOSIS — H00011 Hordeolum externum right upper eyelid: Secondary | ICD-10-CM

## 2020-03-30 DIAGNOSIS — M1712 Unilateral primary osteoarthritis, left knee: Secondary | ICD-10-CM

## 2020-03-30 DIAGNOSIS — I10 Essential (primary) hypertension: Secondary | ICD-10-CM

## 2020-03-30 MED ORDER — BACITRACIN-POLYMYXIN B 500-10000 UNIT/GM OP OINT: 1.0000 "application " | TOPICAL_OINTMENT | Freq: Two times a day (BID) | OPHTHALMIC | 0 refills | Status: DC

## 2020-03-30 MED ORDER — AMLODIPINE BESYLATE 10 MG PO TABS: 10.0000 mg | ORAL_TABLET | Freq: Every day | ORAL | 1 refills | Status: DC

## 2020-03-30 MED ORDER — TRAMADOL HCL 50 MG PO TABS: 50.0000 mg | ORAL_TABLET | Freq: Two times a day (BID) | ORAL | 0 refills | Status: DC | PRN

## 2020-03-30 MED FILL — BACITRACIN-POLYMYXIN EYE OI: 500-10000 | 7 days supply | Qty: 4 | Fill #0

## 2020-03-30 MED FILL — traMADol HCL 50 MG TABS: 50 | 30 days supply | Qty: 60 | Fill #0
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
## 2020-04-13 ENCOUNTER — Encounter: Payer: Self-pay | Admitting: Pharmacist

## 2020-04-13 ENCOUNTER — Ambulatory Visit: Payer: BC Managed Care – PPO | Attending: Nurse Practitioner | Admitting: Pharmacist

## 2020-04-13 ENCOUNTER — Other Ambulatory Visit: Payer: Self-pay

## 2020-04-13 VITALS — BP 138/85 | HR 85

## 2020-04-13 DIAGNOSIS — I10 Essential (primary) hypertension: Secondary | ICD-10-CM

## 2020-04-13 NOTE — Progress Notes (Signed)
PCP: Geryl Rankins, NP  S:    Patient arrives in good spirits. Presents to the clinic for hypertension evaluation, counseling, and management. Patient was referred and last seen by Primary Care Provider on 03/30/20. At this visit her blood pressure was elevated and amlodipine was increased to 10 mg daily. Today, medication adherence and tolerance endorsed and patient is complaining about sinus congestion/headaches and is requesting antibiotics. Patient expressed she thought she was seeing her physician today.  Current BP Medications include: Amlodipine 10 mg daily  Antihypertensives tried in the past include: hydrochlorothiazide (itchy eyes, HA, insomnia, concurrent sulfonamide allergy), lisinopril (cough)  Dietary habits include: limiting soda, sodium and fatty foods Exercise habits limited by knee pain and prior surgeries. Family history: CAD, Liver Cancer, Stroke Social history: former smoker  O:  Vitals:   04/13/20 1111  BP: 138/85  Pulse: 85    Last 3 Office BP readings: BP Readings from Last 3 Encounters:  04/13/20 138/85  03/30/20 (!) 157/84  01/07/20 136/86    BMET    Component Value Date/Time   NA 141 02/18/2020 1425   NA 140 10/11/2013 1320   K 4.7 02/18/2020 1425   K 4.8 10/11/2013 1320   CL 101 02/18/2020 1425   CL 107 10/11/2013 1320   CO2 28 02/18/2020 1425   CO2 29 10/11/2013 1320   GLUCOSE 85 02/18/2020 1425   GLUCOSE 91 07/09/2019 1147   GLUCOSE 85 10/11/2013 1320   BUN 9 02/18/2020 1425   BUN 9 10/11/2013 1320   CREATININE 0.78 02/18/2020 1425   CREATININE 0.74 10/11/2013 1320   CALCIUM 9.5 02/18/2020 1425   CALCIUM 9.0 10/11/2013 1320   GFRNONAA 86 02/18/2020 1425   GFRNONAA >60 10/11/2013 1320   GFRAA 100 02/18/2020 1425   GFRAA >60 10/11/2013 1320   Lipid Panel     Component Value Date/Time   CHOL 267 (H) 02/18/2020 1425   TRIG 249 (H) 02/18/2020 1425   HDL 45 02/18/2020 1425   CHOLHDL 5.9 (H) 02/18/2020 1425   CHOLHDL 6 07/09/2019  1147   VLDL 57.8 (H) 07/09/2019 1147   LDLCALC 175 (H) 02/18/2020 1425   LDLDIRECT 145.0 07/09/2019 1147   LABVLDL 47 (H) 02/18/2020 1425     Renal function: CrCl cannot be calculated (Patient's most recent lab result is older than the maximum 21 days allowed.).  Clinical ASCVD: Yes  The 10-year ASCVD risk score Mikey Bussing DC Jr., et al., 2013) is: 14.9%   Values used to calculate the score:     Age: 54 years     Sex: Female     Is Non-Hispanic African American: Yes     Diabetic: No     Tobacco smoker: No     Systolic Blood Pressure: 947 mmHg     Is BP treated: Yes     HDL Cholesterol: 45 mg/dL     Total Cholesterol: 267 mg/dL   A/P: Hypertension, while much improved with recent changes, is currently above goal on current medications. BP Goal = < 130/80 mmHg. Will not change any medications today as her amlodipine dose was increase two weeks ago. Recommend patient takes amlodipine at nighttime. Medication adherence endorsed.  -Adjusted amlodipine 10 mg to be taken in the evenings. If further blood pressure control needed, consider initiation of an ARB.  -Counseled on lifestyle modifications for blood pressure control including reduced dietary sodium, increased exercise, adequate sleep.  Health Maintenance:  Patient is complaining of sinus congestion and headaches and is requesting to  see PCP for an antibiotic. Advised patient she would need to set an appointment or request a walk-in appointment with available provider. PCP aware of request and advised to schedule a telehealth visit.    Given past medical history and lipid panel from September, patient is a candidate for a high intensity statin. Patient is currently on a moderate dose of rosuvastatin 5 mg daily. Patient amenable to increasing the dose at a later date but expressed she needed to obtain other medications first. When able, recommend increasing rosuvastatin dose.  Results reviewed and written information provided. Total time  in face-to-face counseling 20 minutes.   F/U with PCP when able.   Patient seen with  Jacobo Forest PharmD Candidate, Class of 2022 [place], PharmD, [place] [phone]

## 2020-04-27 ENCOUNTER — Other Ambulatory Visit: Payer: Self-pay | Admitting: Nurse Practitioner

## 2020-04-27 DIAGNOSIS — G894 Chronic pain syndrome: Secondary | ICD-10-CM

## 2020-04-27 DIAGNOSIS — M1712 Unilateral primary osteoarthritis, left knee: Secondary | ICD-10-CM

## 2020-04-27 MED FILL — AMLODIPINE BESYLATE 10 MG T: 10 | 30 days supply | Qty: 30 | Fill #0

## 2020-04-27 MED FILL — OMEPRAZOLE 20 MG CAP: 20 | 30 days supply | Qty: 30 | Fill #2

## 2020-04-27 MED FILL — MELOXICAM 15 MG TABLET: 15 | 30 days supply | Qty: 30 | Fill #2

## 2020-04-27 MED FILL — GABAPENTIN 300 MG CAPSULE: 300 | 30 days supply | Qty: 90 | Fill #2

## 2020-04-27 MED FILL — ?ROSUVASTATIN CALCIUM 5MG T: 5 | 30 days supply | Qty: 30 | Fill #2

## 2020-04-27 NOTE — Telephone Encounter (Signed)
Requested medication (s) are due for refill today: Yes  Requested medication (s) are on the active medication list: Yes  Last refill:  03/30/20  Future visit scheduled: Yes  Notes to clinic:  See request.    Requested Prescriptions  Pending Prescriptions Disp Refills   traMADol (ULTRAM) 50 MG tablet [Pharmacy Med Name: traMADol HCL 50 MG TABS 50 Tablet] 60 tablet 0    Sig: Take 1 tablet (50 mg total) by mouth every 12 (twelve) hours as needed for severe pain.      Not Delegated - Analgesics:  Opioid Agonists Failed - 04/27/2020 11:05 AM      Failed - This refill cannot be delegated      Failed - Urine Drug Screen completed in last 360 days      Passed - Valid encounter within last 6 months    Recent Outpatient Visits           2 weeks ago Essential hypertension   [place], RPH-CPP   4 weeks ago Essential hypertension   [place], Vernia Buff, NP   2 months ago Essential hypertension   [place], Vernia Buff, NP   5 months ago Essential hypertension   [place], Vernia Buff, NP   7 months ago Encounter to establish care   [place], Zelda W, NP       Future Appointments             In 2 months Gildardo Pounds, NP [place]   In 3 months Einar Pheasant, Jobe Marker, MD [employer] [facility] [place], [facility]

## 2020-04-28 ENCOUNTER — Telehealth: Payer: Self-pay | Admitting: Nurse Practitioner

## 2020-04-28 DIAGNOSIS — M1712 Unilateral primary osteoarthritis, left knee: Secondary | ICD-10-CM

## 2020-04-28 DIAGNOSIS — G894 Chronic pain syndrome: Secondary | ICD-10-CM

## 2020-04-28 NOTE — Telephone Encounter (Signed)
Patient requesting RF on medication pending for PCP review- also requesting medication not current on list- sent for review of request

## 2020-04-28 NOTE — Telephone Encounter (Unsigned)
Copied from [place] [phone]. Topic: Quick Communication - Rx Refill/Question >> Apr 28, 2020  9:08 AM Yvette Rack wrote: Medication: traMADol (ULTRAM) 50 MG tablet and tiZANidine (ZANAFLEX) 4 MG tablet  Has the patient contacted their pharmacy? no  Preferred Pharmacy (with phone number or street name): [place], [place] [address] Phone: [phone]  Fax: [phone]  Agent: Please be advised that RX refills may take up to 3 business days. We ask that you follow-up with your pharmacy.

## 2020-05-02 ENCOUNTER — Other Ambulatory Visit: Payer: Self-pay | Admitting: Nurse Practitioner

## 2020-05-02 MED ORDER — TRAMADOL HCL 50 MG PO TABS: 50.0000 mg | ORAL_TABLET | Freq: Every day | ORAL | 0 refills | Status: DC | PRN

## 2020-05-02 NOTE — Telephone Encounter (Signed)
Medication sent. Please let her know to follow up with orthopedics regarding her knee and back pain. She should not be on pain medication indefinitely

## 2020-05-03 MED FILL — traMADol HCL 50 MG TABS: 50 | 30 days supply | Qty: 30 | Fill #0

## 2020-05-06 NOTE — Telephone Encounter (Signed)
Spoke to patient and informed on PCP advising. Pt. Understood.  

## 2020-05-25 ENCOUNTER — Other Ambulatory Visit: Payer: Self-pay | Admitting: Nurse Practitioner

## 2020-05-25 ENCOUNTER — Other Ambulatory Visit: Payer: Self-pay | Admitting: Orthopedic Surgery

## 2020-05-25 DIAGNOSIS — M1712 Unilateral primary osteoarthritis, left knee: Secondary | ICD-10-CM

## 2020-05-25 DIAGNOSIS — G894 Chronic pain syndrome: Secondary | ICD-10-CM

## 2020-05-25 MED FILL — OMEPRAZOLE 20 MG CAP: 20 | 30 days supply | Qty: 30 | Fill #3

## 2020-05-25 MED FILL — ?ROSUVASTATIN CALCIUM 5MG T: 5 | 30 days supply | Qty: 30 | Fill #3

## 2020-05-25 MED FILL — AMLODIPINE BESYLATE 5 MG TA: 5 | 30 days supply | Qty: 30 | Fill #2

## 2020-05-25 NOTE — Telephone Encounter (Signed)
Please advise 

## 2020-05-26 MED FILL — GABAPENTIN 300 MG CAPSULE: 300 | 30 days supply | Qty: 90 | Fill #0

## 2020-05-28 ENCOUNTER — Other Ambulatory Visit: Payer: Self-pay | Admitting: Surgical

## 2020-05-28 MED FILL — MELOXICAM 15 MG TABLET: 15 | 30 days supply | Qty: 30 | Fill #0

## 2020-06-25 MED FILL — MELOXICAM 15 MG TABLET: 15 | 30 days supply | Qty: 30 | Fill #1

## 2020-06-25 MED FILL — AMLODIPINE BESYLATE 10 MG T: 10 | 30 days supply | Qty: 30 | Fill #1

## 2020-06-25 MED FILL — ?ROSUVASTATIN CALCIUM 5MG T: 5 | 30 days supply | Qty: 30 | Fill #4

## 2020-06-25 MED FILL — GABAPENTIN 300 MG CAPSULE: 300 | 30 days supply | Qty: 90 | Fill #1

## 2020-06-30 ENCOUNTER — Ambulatory Visit: Payer: Self-pay | Attending: Nurse Practitioner | Admitting: Nurse Practitioner

## 2020-06-30 ENCOUNTER — Other Ambulatory Visit: Payer: Self-pay

## 2020-06-30 ENCOUNTER — Encounter: Payer: Self-pay | Admitting: Nurse Practitioner

## 2020-06-30 ENCOUNTER — Other Ambulatory Visit: Payer: Self-pay | Admitting: Nurse Practitioner

## 2020-06-30 DIAGNOSIS — I10 Essential (primary) hypertension: Secondary | ICD-10-CM

## 2020-06-30 DIAGNOSIS — M170 Bilateral primary osteoarthritis of knee: Secondary | ICD-10-CM

## 2020-06-30 DIAGNOSIS — J019 Acute sinusitis, unspecified: Secondary | ICD-10-CM

## 2020-06-30 MED ORDER — AMOXICILLIN 875 MG PO TABS: 875.0000 mg | ORAL_TABLET | Freq: Two times a day (BID) | ORAL | 0 refills | Status: DC

## 2020-06-30 MED ORDER — TRAMADOL HCL 50 MG PO TABS: 50.0000 mg | ORAL_TABLET | Freq: Every day | ORAL | 0 refills | Status: DC | PRN

## 2020-06-30 MED FILL — traMADol HCL 50 MG TABS: 50 | 30 days supply | Qty: 30 | Fill #0

## 2020-06-30 MED FILL — AMOXICILLIN 875 MG TABS: 875 | 7 days supply | Qty: 14 | Fill #0
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
## 2020-07-07 ENCOUNTER — Other Ambulatory Visit: Payer: BC Managed Care – PPO

## 2020-07-23 ENCOUNTER — Other Ambulatory Visit: Payer: Self-pay | Admitting: Nurse Practitioner

## 2020-07-23 DIAGNOSIS — M1712 Unilateral primary osteoarthritis, left knee: Secondary | ICD-10-CM

## 2020-07-23 DIAGNOSIS — G894 Chronic pain syndrome: Secondary | ICD-10-CM

## 2020-07-23 MED FILL — GABAPENTIN 300 MG CAPSULE: 300 | 30 days supply | Qty: 90 | Fill #0

## 2020-07-23 MED FILL — AMLODIPINE BESYLATE 10 MG T: 10 | 30 days supply | Qty: 30 | Fill #2

## 2020-07-23 MED FILL — OMEPRAZOLE 20 MG CAP: 20 | 30 days supply | Qty: 30 | Fill #4

## 2020-07-23 MED FILL — ?ROSUVASTATIN CALCIUM 5MG T: 5 | 30 days supply | Qty: 30 | Fill #5

## 2020-07-23 MED FILL — MELOXICAM 15 MG TABLET: 15 | 30 days supply | Qty: 30 | Fill #2

## 2020-07-23 NOTE — Telephone Encounter (Signed)
Requested Prescriptions  Pending Prescriptions Disp Refills  . gabapentin (NEURONTIN) 300 MG capsule [Pharmacy Med Name: GABAPENTIN 300 MG CAPSULE 300 Capsule] 90 capsule 1    Sig: TAKE 1 CAPSULE (300 MG TOTAL) BY MOUTH 3 (THREE) TIMES DAILY.     Neurology: Anticonvulsants - gabapentin Passed - 07/23/2020  3:11 PM      Passed - Valid encounter within last 12 months    Recent Outpatient Visits          3 weeks ago Essential hypertension   [place], Vernia Buff, NP   3 months ago Essential hypertension   [place], RPH-CPP   3 months ago Essential hypertension   [place], Vernia Buff, NP   5 months ago Essential hypertension   [place], Vernia Buff, NP   8 months ago Essential hypertension   [place], Vernia Buff, NP      Future Appointments            In 4 weeks Gildardo Pounds, NP [place]

## 2020-08-09 ENCOUNTER — Other Ambulatory Visit: Payer: Self-pay | Admitting: Nurse Practitioner

## 2020-08-09 ENCOUNTER — Ambulatory Visit: Payer: Self-pay | Attending: Nurse Practitioner

## 2020-08-09 ENCOUNTER — Other Ambulatory Visit: Payer: Self-pay

## 2020-08-09 DIAGNOSIS — E785 Hyperlipidemia, unspecified: Secondary | ICD-10-CM

## 2020-08-09 DIAGNOSIS — E559 Vitamin D deficiency, unspecified: Secondary | ICD-10-CM

## 2020-08-09 DIAGNOSIS — I10 Essential (primary) hypertension: Secondary | ICD-10-CM

## 2020-08-09 DIAGNOSIS — R7989 Other specified abnormal findings of blood chemistry: Secondary | ICD-10-CM

## 2020-08-10 ENCOUNTER — Encounter: Payer: BC Managed Care – PPO | Admitting: Family Medicine

## 2020-08-10 LAB — CMP14+EGFR
ALT: 16 IU/L (ref 0–32)
AST: 20 IU/L (ref 0–40)
Albumin/Globulin Ratio: 1.7 (ref 1.2–2.2)
Albumin: 4.3 g/dL (ref 3.8–4.9)
Alkaline Phosphatase: 108 IU/L (ref 44–121)
BUN/Creatinine Ratio: 13 (ref 9–23)
BUN: 11 mg/dL (ref 6–24)
Bilirubin Total: 0.3 mg/dL (ref 0.0–1.2)
CO2: 23 mmol/L (ref 20–29)
Calcium: 9.6 mg/dL (ref 8.7–10.2)
Chloride: 102 mmol/L (ref 96–106)
Creatinine, Ser: 0.83 mg/dL (ref 0.57–1.00)
GFR calc Af Amer: 92 mL/min/{1.73_m2} (ref >59)
GFR calc non Af Amer: 80 mL/min/{1.73_m2} (ref >59)
Globulin, Total: 2.6 g/dL (ref 1.5–4.5)
Glucose: 102 mg/dL — ABNORMAL HIGH (ref 65–99)
Potassium: 4 mmol/L (ref 3.5–5.2)
Sodium: 141 mmol/L (ref 134–144)
Total Protein: 6.9 g/dL (ref 6.0–8.5)

## 2020-08-10 LAB — CBC
Hematocrit: 40.4 % (ref 34.0–46.6)
Hemoglobin: 13.4 g/dL (ref 11.1–15.9)
MCH: 29.8 pg (ref 26.6–33.0)
MCHC: 33.2 g/dL (ref 31.5–35.7)
MCV: 90 fL (ref 79–97)
Platelets: 440 10*3/uL (ref 150–450)
RBC: 4.49 x10E6/uL (ref 3.77–5.28)
RDW: 13.9 % (ref 11.7–15.4)
WBC: 7.3 10*3/uL (ref 3.4–10.8)

## 2020-08-10 LAB — LIPID PANEL
Chol/HDL Ratio: 3.4 ratio (ref 0.0–4.4)
Cholesterol, Total: 207 mg/dL — ABNORMAL HIGH (ref 100–199)
HDL: 61 mg/dL (ref >39)
LDL Chol Calc (NIH): 120 mg/dL — ABNORMAL HIGH (ref 0–99)
Triglycerides: 147 mg/dL (ref 0–149)
VLDL Cholesterol Cal: 26 mg/dL (ref 5–40)

## 2020-08-10 LAB — VITAMIN D 25 HYDROXY (VIT D DEFICIENCY, FRACTURES): Vit D, 25-Hydroxy: 24.5 ng/mL — ABNORMAL LOW (ref 30.0–100.0)

## 2020-08-20 ENCOUNTER — Ambulatory Visit: Payer: BC Managed Care – PPO | Admitting: Nurse Practitioner

## 2020-08-23 ENCOUNTER — Other Ambulatory Visit: Payer: Self-pay | Admitting: Surgical

## 2020-08-23 MED FILL — AMLODIPINE BESYLATE 10 MG T: 10 | 30 days supply | Qty: 30 | Fill #3

## 2020-08-23 MED FILL — GABAPENTIN 300 MG CAPSULE: 300 | 30 days supply | Qty: 90 | Fill #1

## 2020-08-23 MED FILL — ?ROSUVASTATIN CALCIUM 5MG T: 5 | 30 days supply | Qty: 30 | Fill #6

## 2020-08-23 NOTE — Telephone Encounter (Signed)
Pls advise.  

## 2020-08-24 ENCOUNTER — Other Ambulatory Visit: Payer: Self-pay | Admitting: Surgical

## 2020-08-24 MED FILL — MELOXICAM 15 MG TABLET: 15 | 30 days supply | Qty: 30 | Fill #0

## 2020-09-01 ENCOUNTER — Telehealth: Payer: Self-pay | Admitting: Nurse Practitioner

## 2020-09-01 NOTE — Telephone Encounter (Signed)
Called Pt no answer. Left vm that appt with Zelda on 3/22 will be virtual. Provider will be out the office but working virtual and if in person is preferred to call [phone] to reschedule.

## 2020-09-07 ENCOUNTER — Encounter: Payer: Self-pay | Admitting: Nurse Practitioner

## 2020-09-07 ENCOUNTER — Ambulatory Visit: Payer: Self-pay | Attending: Nurse Practitioner | Admitting: Nurse Practitioner

## 2020-09-07 ENCOUNTER — Other Ambulatory Visit: Payer: Self-pay

## 2020-09-07 ENCOUNTER — Other Ambulatory Visit: Payer: Self-pay | Admitting: Nurse Practitioner

## 2020-09-07 DIAGNOSIS — I10 Essential (primary) hypertension: Secondary | ICD-10-CM

## 2020-09-07 DIAGNOSIS — B379 Candidiasis, unspecified: Secondary | ICD-10-CM

## 2020-09-07 DIAGNOSIS — M170 Bilateral primary osteoarthritis of knee: Secondary | ICD-10-CM

## 2020-09-07 MED ORDER — TRAMADOL HCL 50 MG PO TABS
50.0000 mg | ORAL_TABLET | Freq: Every day | ORAL | 0 refills | Status: DC | PRN
Start: 2020-09-07 — End: 2020-09-07

## 2020-09-07 MED ORDER — NYSTATIN 100000 UNIT/GM EX POWD
CUTANEOUS | 5 refills | Status: DC
Start: 2020-09-07 — End: 2020-09-07

## 2020-09-07 MED FILL — NYSTATIN 100000 UNIT/GM POW: 100000 | 15 days supply | Qty: 60 | Fill #0

## 2020-09-07 MED FILL — traMADol HCL 50 MG TABS: 50 | 30 days supply | Qty: 30 | Fill #0
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
## 2020-09-09 ENCOUNTER — Telehealth: Payer: Self-pay

## 2020-09-09 NOTE — Telephone Encounter (Signed)
Please advise. Patient not seen since 01/2020

## 2020-09-09 NOTE — Telephone Encounter (Signed)
Patient called she is requesting a referral to be sent to [facility] for peripheral neuropathy call [phone]

## 2020-09-14 NOTE — Telephone Encounter (Signed)
We should evaluate her first and then can determine if she needs further referral for source of neuropathy.  Last examination was too long ago, need repeat eval

## 2020-09-14 NOTE — Telephone Encounter (Signed)
Tried calling patient to advise. No answer. No VM to LM

## 2020-09-15 NOTE — Telephone Encounter (Signed)
Tried calling again. No answer. No VM to LM

## 2020-09-18 ENCOUNTER — Other Ambulatory Visit: Payer: Self-pay

## 2020-09-19 ENCOUNTER — Other Ambulatory Visit: Payer: Self-pay

## 2020-09-24 ENCOUNTER — Other Ambulatory Visit: Payer: Self-pay

## 2020-09-24 ENCOUNTER — Other Ambulatory Visit: Payer: Self-pay | Admitting: Nurse Practitioner

## 2020-09-24 DIAGNOSIS — M1712 Unilateral primary osteoarthritis, left knee: Secondary | ICD-10-CM

## 2020-09-24 DIAGNOSIS — G894 Chronic pain syndrome: Secondary | ICD-10-CM

## 2020-09-24 MED ORDER — GABAPENTIN 300 MG PO CAPS
ORAL_CAPSULE | Freq: Three times a day (TID) | ORAL | 1 refills | Status: DC
  Filled 2020-09-24: qty 90, 30d supply, fill #0
  Filled 2020-10-25: qty 90, 30d supply, fill #1

## 2020-09-24 MED FILL — Amlodipine Besylate Tab 10 MG (Base Equivalent): ORAL | 30 days supply | Qty: 30 | Fill #0 | Status: AC

## 2020-09-24 MED FILL — Meloxicam Tab 15 MG: ORAL | 30 days supply | Qty: 30 | Fill #0 | Status: AC

## 2020-09-29 ENCOUNTER — Other Ambulatory Visit: Payer: Self-pay

## 2020-10-21 ENCOUNTER — Other Ambulatory Visit: Payer: Self-pay

## 2020-10-22 ENCOUNTER — Other Ambulatory Visit: Payer: Self-pay

## 2020-10-22 MED FILL — Meloxicam Tab 15 MG: ORAL | 30 days supply | Qty: 30 | Fill #1 | Status: AC

## 2020-10-22 MED FILL — Amlodipine Besylate Tab 10 MG (Base Equivalent): ORAL | 30 days supply | Qty: 30 | Fill #1 | Status: AC

## 2020-10-25 ENCOUNTER — Other Ambulatory Visit: Payer: Self-pay

## 2020-11-23 ENCOUNTER — Other Ambulatory Visit: Payer: Self-pay | Admitting: Nurse Practitioner

## 2020-11-23 ENCOUNTER — Other Ambulatory Visit: Payer: Self-pay

## 2020-11-23 ENCOUNTER — Other Ambulatory Visit: Payer: Self-pay | Admitting: Surgical

## 2020-11-23 ENCOUNTER — Telehealth: Payer: Self-pay | Admitting: Nurse Practitioner

## 2020-11-23 DIAGNOSIS — G894 Chronic pain syndrome: Secondary | ICD-10-CM

## 2020-11-23 DIAGNOSIS — I10 Essential (primary) hypertension: Secondary | ICD-10-CM

## 2020-11-23 DIAGNOSIS — M1712 Unilateral primary osteoarthritis, left knee: Secondary | ICD-10-CM

## 2020-11-23 DIAGNOSIS — M170 Bilateral primary osteoarthritis of knee: Secondary | ICD-10-CM

## 2020-11-23 MED ORDER — AMLODIPINE BESYLATE 10 MG PO TABS
ORAL_TABLET | ORAL | 0 refills | Status: DC
  Filled 2020-11-23: qty 30, 30d supply, fill #0
  Filled 2020-12-22: qty 30, 30d supply, fill #1
  Filled 2021-01-18: qty 30, 30d supply, fill #2

## 2020-11-23 MED ORDER — MELOXICAM 15 MG PO TABS
ORAL_TABLET | Freq: Every day | ORAL | 2 refills | Status: DC
  Filled 2020-11-23: qty 30, 30d supply, fill #0
  Filled 2020-12-22: qty 30, 30d supply, fill #1
  Filled 2021-01-18: qty 30, 30d supply, fill #2

## 2020-11-23 MED ORDER — GABAPENTIN 300 MG PO CAPS
ORAL_CAPSULE | Freq: Three times a day (TID) | ORAL | 1 refills | Status: DC
  Filled 2020-11-23: qty 90, 30d supply, fill #0
  Filled 2020-12-22: qty 90, 30d supply, fill #1

## 2020-11-23 MED FILL — Montelukast Sodium Tab 10 MG (Base Equiv): ORAL | 30 days supply | Qty: 30 | Fill #0 | Status: AC

## 2020-11-23 NOTE — Telephone Encounter (Signed)
Patient called in and stated she is experiencing soreness on the left side of her throat and a clogged/stuffed feeling in her left ear. Patient states she believes its her allergies and is  requesting a medication to be called in for this. Please advise.

## 2020-11-23 NOTE — Telephone Encounter (Signed)
Requested medication (s) are due for refill today: no  Requested medication (s) are on the active medication list:yes   Last refill: 09/07/2020  Future visit scheduled: no  Notes to clinic:  this refill cannot be delegated    Requested Prescriptions  Pending Prescriptions Disp Refills   traMADol (ULTRAM) 50 MG tablet 30 tablet 0    Sig: TAKE 1 TABLET (50 MG TOTAL) BY MOUTH DAILY AS NEEDED.      Not Delegated - Analgesics:  Opioid Agonists Failed - 11/23/2020  9:11 AM      Failed - This refill cannot be delegated      Failed - Urine Drug Screen completed in last 360 days      Passed - Valid encounter within last 6 months    Recent Outpatient Visits           2 months ago Bilateral primary osteoarthritis of knee   [place], Vernia Buff, NP   4 months ago Essential hypertension   [place], Vernia Buff, NP   7 months ago Essential hypertension   [place], RPH-CPP   7 months ago Essential hypertension   [place], Vernia Buff, NP   9 months ago Essential hypertension   [place], Vernia Buff, NP

## 2020-11-24 NOTE — Telephone Encounter (Signed)
Please schedule patient an appointment or refer to mobile unit.

## 2020-11-25 ENCOUNTER — Other Ambulatory Visit: Payer: Self-pay | Admitting: Nurse Practitioner

## 2020-11-25 DIAGNOSIS — M170 Bilateral primary osteoarthritis of knee: Secondary | ICD-10-CM

## 2020-11-25 MED ORDER — TRAMADOL HCL 50 MG PO TABS
ORAL_TABLET | ORAL | 0 refills | Status: DC
  Filled 2020-11-25: qty 30, 30d supply, fill #0

## 2020-11-25 NOTE — Telephone Encounter (Signed)
Provided patient the address for the mobile medicine unit for Monday June the 13th. Patient also requesting update on tramadol refills. Please follow up if appropriate.

## 2020-11-25 NOTE — Telephone Encounter (Signed)
Tramadol fills need to be approved by PCP.

## 2020-11-26 ENCOUNTER — Other Ambulatory Visit: Payer: Self-pay

## 2020-11-29 ENCOUNTER — Other Ambulatory Visit: Payer: Self-pay

## 2020-12-06 ENCOUNTER — Other Ambulatory Visit: Payer: Self-pay

## 2020-12-22 ENCOUNTER — Other Ambulatory Visit: Payer: Self-pay

## 2020-12-22 MED FILL — Omeprazole Cap Delayed Release 20 MG: ORAL | 30 days supply | Qty: 30 | Fill #0 | Status: AC

## 2020-12-23 ENCOUNTER — Other Ambulatory Visit: Payer: Self-pay

## 2021-01-18 ENCOUNTER — Other Ambulatory Visit: Payer: Self-pay

## 2021-01-18 ENCOUNTER — Other Ambulatory Visit: Payer: Self-pay | Admitting: Orthopedic Surgery

## 2021-01-18 ENCOUNTER — Other Ambulatory Visit: Payer: Self-pay | Admitting: Nurse Practitioner

## 2021-01-18 DIAGNOSIS — M1712 Unilateral primary osteoarthritis, left knee: Secondary | ICD-10-CM

## 2021-01-18 DIAGNOSIS — G894 Chronic pain syndrome: Secondary | ICD-10-CM

## 2021-01-18 MED ORDER — GABAPENTIN 300 MG PO CAPS
ORAL_CAPSULE | Freq: Three times a day (TID) | ORAL | 1 refills | Status: DC
  Filled 2021-01-18: qty 90, 30d supply, fill #0
  Filled 2021-02-22: qty 90, 30d supply, fill #1

## 2021-01-18 MED FILL — Omeprazole Cap Delayed Release 20 MG: ORAL | 30 days supply | Qty: 30 | Fill #1 | Status: AC

## 2021-01-20 ENCOUNTER — Other Ambulatory Visit: Payer: Self-pay | Admitting: Neurosurgery

## 2021-01-20 ENCOUNTER — Other Ambulatory Visit (HOSPITAL_COMMUNITY): Payer: Self-pay | Admitting: Neurosurgery

## 2021-01-20 DIAGNOSIS — D329 Benign neoplasm of meninges, unspecified: Secondary | ICD-10-CM

## 2021-01-21 ENCOUNTER — Other Ambulatory Visit: Payer: Self-pay

## 2021-01-21 MED ORDER — METHOCARBAMOL 500 MG PO TABS
500.0000 mg | ORAL_TABLET | Freq: Three times a day (TID) | ORAL | 1 refills | Status: DC | PRN
  Filled 2021-01-21: qty 30, 10d supply, fill #0
  Filled 2021-02-22: qty 30, 10d supply, fill #1

## 2021-01-24 ENCOUNTER — Other Ambulatory Visit: Payer: Self-pay | Admitting: Nurse Practitioner

## 2021-01-24 ENCOUNTER — Other Ambulatory Visit: Payer: Self-pay

## 2021-01-24 DIAGNOSIS — M170 Bilateral primary osteoarthritis of knee: Secondary | ICD-10-CM

## 2021-01-25 ENCOUNTER — Other Ambulatory Visit: Payer: Self-pay

## 2021-01-25 MED ORDER — TRAMADOL HCL 50 MG PO TABS
ORAL_TABLET | ORAL | 0 refills | Status: DC
  Filled 2021-01-25: qty 30, 30d supply, fill #0

## 2021-01-26 ENCOUNTER — Other Ambulatory Visit: Payer: Self-pay

## 2021-01-28 ENCOUNTER — Ambulatory Visit: Payer: Self-pay | Admitting: *Deleted

## 2021-01-28 NOTE — Telephone Encounter (Signed)
Pt called in to speak with a nurse. Pt  says that she had left knee surgery a little while ago and since has been experiencing some leg swelling. Pt says due to insurance reasons she hasn't been able to follow up with Ortho. Pt asked if she should be concerned about the swelling and is requesting a call back to discuss further.   Patient called back and reports left knee pain and swelling is becoming worse. Swelling worse after walking on left leg. Pain and swelling comes and goes. Denies redness, chest pain, difficulty breathing, fever, calf pain. C/o meloxicam and ultram not effective in managing pain . Appt scheduled for 03/18/21. Please notify patient if earlier appt available . Care advise given. Patient verbalized understanding of care advise and to call back or go to [facility] or ED if symptoms worsen.    Reason for Disposition  [1] MODERATE pain (e.g., interferes with normal activities, limping) AND [2] present > 3 days  Additional Information  Commented on: Answer Assessment    Patient reports swelling in left knee to foot. Pain and swelling comes and goes. Difficulty walking on left leg and makes swelling of knee worse. Denies redness, chest pain, difficulty breathing, fever, calf pain. Pain not relieved by meloxicam or ultram anymore.  Protocols used: Knee Swelling-A-AH

## 2021-01-28 NOTE — Telephone Encounter (Signed)
Pt called in to speak with a nurse. Pt  says that she had left knee surgery a little while ago and since has been experiencing some leg swelling. Pt says due to insurance reasons she hasn't been able to follow up with Ortho. Pt asked if she should be concerned about the swelling and is requesting a call back to discuss further.   Left message on VM to call office.

## 2021-01-28 NOTE — Telephone Encounter (Signed)
Answer Assessment - Initial Assessment Questions 1. LOCATION: "Where is the swelling located?"  (e.g., left, right, both knees)     Ot was calling in regarding swelling in her knee but the line disconnected or she hung up before the agent could get her connected to the nurse.   2. SIZE and DESCRIPTION: "What does the swelling look like?"  (e.g., entire knee, localized)     *No Answer* 3. ONSET: "When did the swelling start?" "Does it come and go, or is it there all the time?"     *No Answer* 4. PAIN: "Is there any pain?" If Yes, ask: "How bad is it?" (Scale 1-10; or mild, moderate, severe)     *No Answer* 5. SETTING: "Has there been any recent work, exercise or other activity that involved that part of the body?"      *No Answer* 6. AGGRAVATING FACTORS: "What makes the knee swelling worse?" (e.g., walking, climbing stairs, running)     *No Answer* 7. ASSOCIATED SYMPTOMS: "Is there any pain or redness?"     *No Answer* 8. OTHER SYMPTOMS: "Do you have any other symptoms?" (e.g., chest pain, difficulty breathing, fever, calf pain)     *No Answer* 9. PREGNANCY: "Is there any chance you are pregnant?" "When was your last menstrual period?"     *No Answer*  Protocols used: Knee Swelling-A-AH

## 2021-01-28 NOTE — Telephone Encounter (Signed)
Second attempt to call patient- left message to call office. 

## 2021-02-22 ENCOUNTER — Other Ambulatory Visit: Payer: Self-pay | Admitting: Nurse Practitioner

## 2021-02-22 ENCOUNTER — Other Ambulatory Visit: Payer: Self-pay | Admitting: Surgical

## 2021-02-22 ENCOUNTER — Other Ambulatory Visit: Payer: Self-pay

## 2021-02-22 DIAGNOSIS — M170 Bilateral primary osteoarthritis of knee: Secondary | ICD-10-CM

## 2021-02-22 DIAGNOSIS — I10 Essential (primary) hypertension: Secondary | ICD-10-CM

## 2021-02-23 ENCOUNTER — Other Ambulatory Visit: Payer: Self-pay

## 2021-02-23 MED ORDER — AMLODIPINE BESYLATE 10 MG PO TABS
ORAL_TABLET | ORAL | 0 refills | Status: DC
  Filled 2021-02-23: qty 30, 30d supply, fill #0
  Filled 2021-03-28: qty 30, 30d supply, fill #1
  Filled 2021-04-25: qty 30, 30d supply, fill #2

## 2021-02-23 NOTE — Telephone Encounter (Signed)
Requested Prescriptions  Pending Prescriptions Disp Refills  . amLODipine (NORVASC) 10 MG tablet 90 tablet 0    Sig: TAKE 1 TABLET (10 MG TOTAL) BY MOUTH DAILY. TAKE 1 TABLET BY MOUTH EVERY DAY IN THE MORNING     Cardiovascular:  Calcium Channel Blockers Passed - 02/22/2021  9:06 AM      Passed - Last BP in normal range    BP Readings from Last 1 Encounters:  04/13/20 138/85         Passed - Valid encounter within last 6 months    Recent Outpatient Visits          5 months ago Bilateral primary osteoarthritis of knee   [place], Vernia Buff, NP   7 months ago Essential hypertension   [place], Vernia Buff, NP   10 months ago Essential hypertension   [place], Jarome [place], RPH-CPP   11 months ago Essential hypertension   [place], Vernia Buff, NP   1 year ago Essential hypertension   [place], Vernia Buff, NP      Future Appointments            In 3 weeks Gildardo Pounds, NP [place]           . traMADol (ULTRAM) 50 MG tablet 30 tablet 0    Sig: TAKE 1 TABLET (50 MG TOTAL) BY MOUTH DAILY AS NEEDED.     Not Delegated - Analgesics:  Opioid Agonists Failed - 02/22/2021  9:06 AM      Failed - This refill cannot be delegated      Failed - Urine Drug Screen completed in last 360 days      Passed - Valid encounter within last 6 months    Recent Outpatient Visits          5 months ago Bilateral primary osteoarthritis of knee   [place], Vernia Buff, NP   7 months ago Essential hypertension   [place], Vernia Buff, NP   10 months ago Essential hypertension   [place], RPH-CPP   11 months ago Essential hypertension   [place], Vernia Buff, NP   1 year ago Essential hypertension   [place], Vernia Buff, NP      Future Appointments            In 3 weeks Gildardo Pounds, NP [place]

## 2021-02-23 NOTE — Telephone Encounter (Signed)
Requested medications are due for refill today.  yes  Requested medications are on the active medications list.  yes  Last refill. 01/25/2021  Future visit scheduled.   yes  Notes to clinic.  Medication not delegated.

## 2021-02-27 ENCOUNTER — Other Ambulatory Visit: Payer: Self-pay | Admitting: Nurse Practitioner

## 2021-02-27 DIAGNOSIS — M170 Bilateral primary osteoarthritis of knee: Secondary | ICD-10-CM

## 2021-02-27 MED ORDER — MELOXICAM 15 MG PO TABS
ORAL_TABLET | Freq: Every day | ORAL | 2 refills | Status: DC
  Filled 2021-02-27: qty 30, 30d supply, fill #0
  Filled 2021-03-28: qty 30, 30d supply, fill #1
  Filled 2021-04-25: qty 30, 30d supply, fill #2

## 2021-02-27 MED ORDER — TRAMADOL HCL 50 MG PO TABS: 50.0000 mg | ORAL_TABLET | Freq: Two times a day (BID) | ORAL | 0 refills | Status: DC | PRN

## 2021-02-27 MED ORDER — TRAMADOL HCL 50 MG PO TABS
50.0000 mg | ORAL_TABLET | Freq: Two times a day (BID) | ORAL | 0 refills | Status: DC | PRN
Start: 2021-02-27 — End: 2021-03-28
  Filled 2021-02-27: qty 30, 15d supply, fill #0

## 2021-02-28 ENCOUNTER — Other Ambulatory Visit: Payer: Self-pay

## 2021-02-28 ENCOUNTER — Other Ambulatory Visit: Payer: Self-pay | Admitting: Nurse Practitioner

## 2021-02-28 DIAGNOSIS — E7800 Pure hypercholesterolemia, unspecified: Secondary | ICD-10-CM

## 2021-02-28 MED ORDER — ROSUVASTATIN CALCIUM 5 MG PO TABS
5.0000 mg | ORAL_TABLET | Freq: Every day | ORAL | 0 refills | Status: DC
  Filled 2021-02-28: qty 30, 30d supply, fill #0
  Filled 2021-03-28: qty 30, 30d supply, fill #1
  Filled 2021-04-25: qty 30, 30d supply, fill #2

## 2021-03-01 ENCOUNTER — Other Ambulatory Visit: Payer: Self-pay

## 2021-03-18 ENCOUNTER — Ambulatory Visit: Payer: Self-pay | Admitting: Nurse Practitioner

## 2021-03-28 ENCOUNTER — Other Ambulatory Visit: Payer: Self-pay | Admitting: Nurse Practitioner

## 2021-03-28 ENCOUNTER — Other Ambulatory Visit: Payer: Self-pay

## 2021-03-28 DIAGNOSIS — G894 Chronic pain syndrome: Secondary | ICD-10-CM

## 2021-03-28 DIAGNOSIS — M1712 Unilateral primary osteoarthritis, left knee: Secondary | ICD-10-CM

## 2021-03-28 DIAGNOSIS — M170 Bilateral primary osteoarthritis of knee: Secondary | ICD-10-CM

## 2021-03-29 ENCOUNTER — Other Ambulatory Visit: Payer: Self-pay

## 2021-03-29 MED ORDER — TRAMADOL HCL 50 MG PO TABS
50.0000 mg | ORAL_TABLET | Freq: Two times a day (BID) | ORAL | 0 refills | Status: DC | PRN
  Filled 2021-03-29: qty 30, 15d supply, fill #0

## 2021-03-29 MED ORDER — GABAPENTIN 300 MG PO CAPS
ORAL_CAPSULE | Freq: Three times a day (TID) | ORAL | 1 refills | Status: DC
  Filled 2021-03-29: qty 90, 30d supply, fill #0
  Filled 2021-04-25: qty 90, 30d supply, fill #1

## 2021-03-29 NOTE — Telephone Encounter (Signed)
Refill on 10-13

## 2021-03-29 NOTE — Telephone Encounter (Signed)
Requested medications are due for refill today yes  Requested medications are on the active medication list yes  Last refill 03/01/21  Last visit 09/07/20  Future visit scheduled 04/29/21  Notes to clinic failed protocol due to no UDS in 360 days, not delegated.

## 2021-04-01 ENCOUNTER — Other Ambulatory Visit: Payer: Self-pay

## 2021-04-25 ENCOUNTER — Other Ambulatory Visit: Payer: Self-pay

## 2021-04-25 ENCOUNTER — Other Ambulatory Visit: Payer: Self-pay | Admitting: Nurse Practitioner

## 2021-04-25 ENCOUNTER — Emergency Department
Admission: EM | Admit: 2021-04-25 | Discharge: 2021-04-25 | Disposition: A | Discharge status: Home / Self Care | Payer: Medicaid Other | Attending: Emergency Medicine | Admitting: Emergency Medicine

## 2021-04-25 ENCOUNTER — Emergency Department: Payer: Medicaid Other

## 2021-04-25 DIAGNOSIS — M7989 Other specified soft tissue disorders: Secondary | ICD-10-CM

## 2021-04-25 DIAGNOSIS — I10 Essential (primary) hypertension: Secondary | ICD-10-CM | POA: Insufficient documentation

## 2021-04-25 DIAGNOSIS — R2242 Localized swelling, mass and lump, left lower limb: Secondary | ICD-10-CM | POA: Diagnosis present

## 2021-04-25 DIAGNOSIS — M170 Bilateral primary osteoarthritis of knee: Secondary | ICD-10-CM

## 2021-04-25 NOTE — Telephone Encounter (Signed)
Requested medications are due for refill today.  yes  Requested medications are on the active medications list.  yes  Last refill. 03/31/2021  Future visit scheduled.   yes  Notes to clinic.  Medication is not delegated.
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
## 2021-04-25 NOTE — ED Provider Notes (Signed)
Emergency Medicine Provider Triage Evaluation Note  Kendra Wang , a 55 y.o. female  was evaluated in triage.  Pt complains of left leg swelling, some right leg pain with numbness and tingling.  History of back.  No new injuries.  No chest pain or shortness of breath.  Review of Systems  Positive: Right leg pain, left leg is swollen and tender Negative: No chest pain, shortness of breath, fever chills  Physical Exam  BP (!) 135/91 (BP Location: Right Arm)   Pulse 78   Temp 98.5 F (36.9 C) (Oral)   Resp 16   SpO2 100%  Gen:   Awake, no distress   Resp:  Normal effort  MSK:   Moves extremities without difficulty  Other:    Medical Decision Making  Medically screening exam initiated at 2:27 PM.  Appropriate orders placed.  Kendra Wang was informed that the remainder of the evaluation will be completed by another provider, this initial triage assessment does not replace that evaluation, and the importance of remaining in the ED until their evaluation is complete.  Ultrasound left lower extremity ordered   Versie Starks, PA-C 04/25/21 1428    Blake Divine, MD 04/25/21 1534

## 2021-04-25 NOTE — Discharge Instructions (Addendum)
Your ultrasound was negative for blood clot.  You may have some leaky veins which is causing the swelling.  If you continue to have symptoms please follow-up with your primary care provider as he may need to have repeat ultrasound done in 1 to 2 weeks.  He should elevate the leg and you can try compression stockings to help with the swelling.

## 2021-04-25 NOTE — ED Notes (Signed)
Pt present to ED with c/o of R leg calf pain and L lower leg swelling. Pt states soreness and swelling to L leg.   Pt denies chest pain or SOB.

## 2021-04-26 ENCOUNTER — Other Ambulatory Visit: Payer: Self-pay

## 2021-04-27 MED ORDER — TRAMADOL HCL 50 MG PO TABS
50.0000 mg | ORAL_TABLET | Freq: Two times a day (BID) | ORAL | 0 refills | Status: AC | PRN
  Filled 2021-04-27: qty 10, 5d supply, fill #0
  Filled 2021-05-29: qty 10, 5d supply, fill #1

## 2021-04-28 ENCOUNTER — Other Ambulatory Visit: Payer: Self-pay

## 2021-04-29 ENCOUNTER — Other Ambulatory Visit: Payer: Self-pay

## 2021-04-29 ENCOUNTER — Ambulatory Visit: Payer: Medicaid Other | Attending: Nurse Practitioner | Admitting: Nurse Practitioner

## 2021-04-29 ENCOUNTER — Encounter: Payer: Self-pay | Admitting: Nurse Practitioner

## 2021-04-29 VITALS — BP 110/74 | HR 78 | Ht 62.0 in | Wt 253.5 lb

## 2021-04-29 DIAGNOSIS — I10 Essential (primary) hypertension: Secondary | ICD-10-CM

## 2021-04-29 DIAGNOSIS — M1712 Unilateral primary osteoarthritis, left knee: Secondary | ICD-10-CM

## 2021-04-29 DIAGNOSIS — M7989 Other specified soft tissue disorders: Secondary | ICD-10-CM | POA: Diagnosis not present

## 2021-04-29 DIAGNOSIS — R7309 Other abnormal glucose: Secondary | ICD-10-CM

## 2021-04-29 DIAGNOSIS — K219 Gastro-esophageal reflux disease without esophagitis: Secondary | ICD-10-CM | POA: Diagnosis not present

## 2021-04-29 DIAGNOSIS — E7800 Pure hypercholesterolemia, unspecified: Secondary | ICD-10-CM

## 2021-04-29 MED ORDER — MELOXICAM 15 MG PO TABS
ORAL_TABLET | Freq: Every day | ORAL | 2 refills | Status: DC
  Filled 2021-04-29: qty 30, fill #0
  Filled 2021-05-29: qty 30, 30d supply, fill #0
  Filled 2021-06-24: qty 30, 30d supply, fill #1
  Filled 2021-06-24: qty 30, 30d supply, fill #0

## 2021-04-29 MED ORDER — OMEPRAZOLE 20 MG PO CPDR
20.0000 mg | DELAYED_RELEASE_CAPSULE | Freq: Every day | ORAL | 1 refills | Status: DC
  Filled 2021-04-29 – 2021-07-06 (×2): qty 90, 90d supply, fill #0

## 2021-04-29 MED ORDER — AMLODIPINE BESYLATE 10 MG PO TABS
10.0000 mg | ORAL_TABLET | Freq: Every day | ORAL | 1 refills | Status: DC
  Filled 2021-04-29 – 2021-05-29 (×2): qty 90, 90d supply, fill #0
  Filled 2021-06-24: qty 90, 90d supply, fill #1
  Filled 2021-07-06: qty 90, 90d supply, fill #0

## 2021-04-29 MED ORDER — ROSUVASTATIN CALCIUM 5 MG PO TABS
5.0000 mg | ORAL_TABLET | Freq: Every day | ORAL | 1 refills | Status: DC
  Filled 2021-04-29 – 2021-05-29 (×2): qty 90, 90d supply, fill #0
  Filled 2021-06-24: qty 90, 90d supply, fill #1
  Filled 2021-07-06: qty 90, 90d supply, fill #0

## 2021-04-29 MED ORDER — CHLORTHALIDONE 25 MG PO TABS
12.5000 mg | ORAL_TABLET | Freq: Every day | ORAL | 2 refills | Status: DC | PRN
  Filled 2021-04-29 – 2021-07-06 (×2): qty 15, 30d supply, fill #0

## 2021-04-29 MED ORDER — GABAPENTIN 300 MG PO CAPS
ORAL_CAPSULE | Freq: Three times a day (TID) | ORAL | 1 refills | Status: DC
  Filled 2021-04-29: qty 90, fill #0
  Filled 2021-05-29 – 2021-06-24 (×2): qty 90, 30d supply, fill #0
  Filled 2021-06-24: qty 90, 30d supply, fill #1
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
## 2021-04-30 LAB — CMP14+EGFR
ALT: 12 IU/L (ref 0–32)
AST: 15 IU/L (ref 0–40)
Albumin/Globulin Ratio: 2 (ref 1.2–2.2)
Albumin: 4.5 g/dL (ref 3.8–4.9)
Alkaline Phosphatase: 131 IU/L — ABNORMAL HIGH (ref 44–121)
BUN/Creatinine Ratio: 11 (ref 9–23)
BUN: 10 mg/dL (ref 6–24)
Bilirubin Total: 0.3 mg/dL (ref 0.0–1.2)
CO2: 28 mmol/L (ref 20–29)
Calcium: 9.9 mg/dL (ref 8.7–10.2)
Chloride: 100 mmol/L (ref 96–106)
Creatinine, Ser: 0.93 mg/dL (ref 0.57–1.00)
Globulin, Total: 2.2 g/dL (ref 1.5–4.5)
Glucose: 97 mg/dL (ref 70–99)
Potassium: 4.1 mmol/L (ref 3.5–5.2)
Sodium: 140 mmol/L (ref 134–144)
Total Protein: 6.7 g/dL (ref 6.0–8.5)
eGFR: 73 mL/min/{1.73_m2} (ref >59)

## 2021-04-30 LAB — HEMOGLOBIN A1C
Est. average glucose Bld gHb Est-mCnc: 117 mg/dL
Hgb A1c MFr Bld: 5.7 % — ABNORMAL HIGH (ref 4.8–5.6)

## 2021-05-02 ENCOUNTER — Other Ambulatory Visit: Payer: Self-pay

## 2021-05-19 ENCOUNTER — Ambulatory Visit: Payer: Self-pay | Admitting: *Deleted

## 2021-05-19 ENCOUNTER — Other Ambulatory Visit: Payer: Self-pay

## 2021-05-19 ENCOUNTER — Telehealth: Payer: Medicaid Other | Admitting: Physician Assistant

## 2021-05-19 DIAGNOSIS — J40 Bronchitis, not specified as acute or chronic: Secondary | ICD-10-CM | POA: Diagnosis not present

## 2021-05-19 MED ORDER — PREDNISONE 20 MG PO TABS
40.0000 mg | ORAL_TABLET | Freq: Every day | ORAL | 0 refills | Status: DC
  Filled 2021-05-19: qty 10, 5d supply, fill #0

## 2021-05-19 MED ORDER — ALBUTEROL SULFATE HFA 108 (90 BASE) MCG/ACT IN AERS
1.0000 | INHALATION_SPRAY | Freq: Four times a day (QID) | RESPIRATORY_TRACT | 0 refills | Status: DC | PRN
  Filled 2021-05-19: qty 8, fill #0

## 2021-05-19 MED ORDER — ALBUTEROL SULFATE HFA 108 (90 BASE) MCG/ACT IN AERS
1.0000 | INHALATION_SPRAY | Freq: Four times a day (QID) | RESPIRATORY_TRACT | 0 refills | Status: DC | PRN
  Filled 2021-05-19: qty 18, 25d supply, fill #0

## 2021-05-19 MED ORDER — PSEUDOEPH-BROMPHEN-DM 30-2-10 MG/5ML PO SYRP
5.0000 mL | ORAL_SOLUTION | Freq: Four times a day (QID) | ORAL | 0 refills | Status: DC | PRN
Start: 2021-05-19 — End: 2021-07-07
  Filled 2021-05-19 (×3): qty 120, 6d supply, fill #0

## 2021-05-19 NOTE — Progress Notes (Signed)
Virtual Visit Consent   Kendra Wang, you are scheduled for a virtual visit with a [facility] provider today.     Just as with appointments in the office, your consent must be obtained to participate.  Your consent will be active for this visit and any virtual visit you may have with one of our providers in the next 365 days.     If you have a MyChart account, a copy of this consent can be sent to you electronically.  All virtual visits are billed to your insurance company just like a traditional visit in the office.    As this is a virtual visit, video technology does not allow for your provider to perform a traditional examination.  This may limit your provider's ability to fully assess your condition.  If your provider identifies any concerns that need to be evaluated in person or the need to arrange testing (such as labs, EKG, etc.), we will make arrangements to do so.     Although advances in technology are sophisticated, we cannot ensure that it will always work on either your end or our end.  If the connection with a video visit is poor, the visit may have to be switched to a telephone visit.  With either a video or telephone visit, we are not always able to ensure that we have a secure connection.     I need to obtain your verbal consent now.   Are you willing to proceed with your visit today?    Kendra Wang has provided verbal consent on 05/19/2021 for a virtual visit (video or telephone).   Mar Daring, PA-C   Date: 05/19/2021 2:24 PM   Virtual Visit via Video Note   I, Mar Daring, connected with  Kendra Wang  ([id number], 1965-12-28) on 05/19/21 at  2:15 PM EST by a video-enabled telemedicine application and verified that I am speaking with the correct person using two identifiers.  Location: Patient: Virtual Visit Location Patient: Home Provider: Virtual Visit Location Provider: Home Office   I discussed the limitations of evaluation and management by  telemedicine and the availability of in person appointments. The patient expressed understanding and agreed to proceed.    Interactive audio and video communications were attempted, although failed due to patient's inability to connect to video. Continued visit with audio only interaction with patient agreement.   History of Present Illness: Kendra Wang is a 55 y.o. who identifies as a female who was assigned female at birth, and is being seen today for cough.  HPI: Cough This is a new problem. The current episode started in the past 7 days (3 days ago). The problem has been gradually worsening. The problem occurs constantly. The cough is Productive of sputum. Associated symptoms include postnasal drip (chronic) and wheezing. Pertinent negatives include no chills, ear congestion, ear pain, fever, headaches, myalgias, nasal congestion, rhinorrhea, sore throat, shortness of breath or sweats. Associated symptoms comments: Chest congestion. The symptoms are aggravated by lying down. Treatments tried: Nyquil. The treatment provided no relief. Her past medical history is significant for bronchitis and environmental allergies. There is no history of asthma or pneumonia.     Problems:  Patient Active Problem List   Diagnosis Date Noted   Bilateral primary osteoarthritis of knee 08/20/2019   Chronic pain syndrome 08/20/2019   Lumbar facet arthropathy 07/21/2019   Well woman exam with routine gynecological exam 07/09/2019   Complex tear of medial meniscus of left knee as current injury  03/10/2019   Primary osteoarthritis of left knee 03/10/2019   BMI 45.0-49.9, adult ([facility]) 01/20/2019   Well woman exam without gynecological exam 09/24/2017   Cough due to ACE inhibitor 02/06/2017   HLD (hyperlipidemia) 09/19/2016   Obesity 09/02/2014   Vitamin D deficiency 01/31/2010   DEPRESSION 01/31/2010   ALLERGIC RHINITIS 01/31/2010   GERD 01/31/2010   HTN (hypertension) 01/31/2010    Allergies:   Allergies  Allergen Reactions   Ace Inhibitors Cough   Hctz [Hydrochlorothiazide] Other (See Comments)    Itchy eyes, headache, insomnia   Sulfonamide Derivatives Other (See Comments)     GI upset   Medications:  Current Outpatient Medications:    albuterol (VENTOLIN HFA) 108 (90 Base) MCG/ACT inhaler, Inhale 1-2 puffs into the lungs every 6 (six) hours as needed for wheezing or shortness of breath., Disp: 8 g, Rfl: 0   brompheniramine-pseudoephedrine-DM 30-2-10 MG/5ML syrup, Take 5 mLs by mouth 4 (four) times daily as needed., Disp: 120 mL, Rfl: 0   predniSONE (DELTASONE) 20 MG tablet, Take 2 tablets (40 mg total) by mouth daily with breakfast., Disp: 10 tablet, Rfl: 0   acetaminophen (TYLENOL) 500 MG tablet, Take 500 mg by mouth every 6 (six) hours as needed (for pain.)., Disp: , Rfl:    amLODipine (NORVASC) 10 MG tablet, Take 1 tablet (10 mg total) by mouth daily., Disp: 90 tablet, Rfl: 1   amoxicillin (AMOXIL) 875 MG tablet, TAKE 1 TABLET (875 MG TOTAL) BY MOUTH 2 (TWO) TIMES DAILY FOR 7 DAYS., Disp: 14 tablet, Rfl: 0   chlorthalidone (HYGROTON) 25 MG tablet, Take 0.5 tablets (12.5 mg total) by mouth daily as needed. Only take as needed for bilateral lower extremity edema, Disp: 15 tablet, Rfl: 2   gabapentin (NEURONTIN) 300 MG capsule, TAKE 1 CAPSULE (300 MG TOTAL) BY MOUTH 3 (THREE) TIMES DAILY., Disp: 90 capsule, Rfl: 1   meloxicam (MOBIC) 15 MG tablet, TAKE 1 TABLET (15 MG TOTAL) BY MOUTH DAILY., Disp: 30 tablet, Rfl: 2   methocarbamol (ROBAXIN) 500 MG tablet, Take 1 tablet (500 mg total) by mouth every 8 (eight) hours as needed for muscle spasms., Disp: 30 tablet, Rfl: 1   montelukast (SINGULAIR) 10 MG tablet, TAKE 1 TABLET (10 MG TOTAL) BY MOUTH AT BEDTIME., Disp: 30 tablet, Rfl: 11   nystatin (MYCOSTATIN/NYSTOP) powder, 0PPLY TO AFFECTED AREA 4 TIMES A DAY, Disp: 60 g, Rfl: 5   omeprazole (PRILOSEC) 20 MG capsule, Take 1 capsule (20 mg total) by mouth daily., Disp: 90 capsule, Rfl:  1   rosuvastatin (CRESTOR) 5 MG tablet, Take 1 tablet (5 mg total) by mouth daily., Disp: 90 tablet, Rfl: 1   traMADol (ULTRAM) 50 MG tablet, Take 1 tablet (50 mg total) by mouth every 12 (twelve) hours as needed., Disp: 30 tablet, Rfl: 0   vitamin C (ASCORBIC ACID) 500 MG tablet, Take 500 mg by mouth daily., Disp: , Rfl:    Vitamin D, Ergocalciferol, (DRISDOL) 1.25 MG (50000 UNIT) CAPS capsule, Take 1 capsule (50,000 Units total) by mouth every 7 (seven) days., Disp: 12 capsule, Rfl: 0  Observations/Objective: Patient is well-developed, well-nourished in no acute distress.  Resting comfortably at home.  Head is normocephalic, atraumatic.  No labored breathing.  Speech is clear and coherent with logical content.  Patient is alert and oriented at baseline.    Assessment and Plan: 1. Bronchitis - predniSONE (DELTASONE) 20 MG tablet; Take 2 tablets (40 mg total) by mouth daily with breakfast.  Dispense: 10 tablet; Refill:  0 - albuterol (VENTOLIN HFA) 108 (90 Base) MCG/ACT inhaler; Inhale 1-2 puffs into the lungs every 6 (six) hours as needed for wheezing or shortness of breath.  Dispense: 8 g; Refill: 0 - brompheniramine-pseudoephedrine-DM 30-2-10 MG/5ML syrup; Take 5 mLs by mouth 4 (four) times daily as needed.  Dispense: 120 mL; Refill: 0  - Worsening despite OTC treatments  - Will treat with albuterol, prednisone and Bromfed DM.  - Push fluids.  - Rest.  - Call or seek in person evaluation if worsening.   Follow Up Instructions: I discussed the assessment and treatment plan with the patient. The patient was provided an opportunity to ask questions and all were answered. The patient agreed with the plan and demonstrated an understanding of the instructions.  A copy of instructions were sent to the patient via MyChart unless otherwise noted below.    The patient was advised to call back or seek an in-person evaluation if the symptoms worsen or if the condition fails to improve as  anticipated.  Time:  I spent 13 minutes with the patient via telehealth technology discussing the above problems/concerns.    Mar Daring, PA-C

## 2021-05-19 NOTE — Telephone Encounter (Addendum)
Chief :complaint cough Hx of symptom:productive cough  Interval: 3 days Evaluation +/-- no other symptoms Final- virtual visit/UC

## 2021-05-19 NOTE — Patient Instructions (Signed)
[name], thank you for joining Mar Daring, PA-C for today's virtual visit.  While this provider is not your primary care provider (PCP), if your PCP is located in our provider database this encounter information will be shared with them immediately following your visit.  Consent: (Patient) Kendra Wang provided verbal consent for this virtual visit at the beginning of the encounter.  Current Medications:  Current Outpatient Medications:    albuterol (VENTOLIN HFA) 108 (90 Base) MCG/ACT inhaler, Inhale 1-2 puffs into the lungs every 6 (six) hours as needed for wheezing or shortness of breath., Disp: 8 g, Rfl: 0   brompheniramine-pseudoephedrine-DM 30-2-10 MG/5ML syrup, Take 5 mLs by mouth 4 (four) times daily as needed., Disp: 120 mL, Rfl: 0   predniSONE (DELTASONE) 20 MG tablet, Take 2 tablets (40 mg total) by mouth daily with breakfast., Disp: 10 tablet, Rfl: 0   acetaminophen (TYLENOL) 500 MG tablet, Take 500 mg by mouth every 6 (six) hours as needed (for pain.)., Disp: , Rfl:    amLODipine (NORVASC) 10 MG tablet, Take 1 tablet (10 mg total) by mouth daily., Disp: 90 tablet, Rfl: 1   amoxicillin (AMOXIL) 875 MG tablet, TAKE 1 TABLET (875 MG TOTAL) BY MOUTH 2 (TWO) TIMES DAILY FOR 7 DAYS., Disp: 14 tablet, Rfl: 0   chlorthalidone (HYGROTON) 25 MG tablet, Take 0.5 tablets (12.5 mg total) by mouth daily as needed. Only take as needed for bilateral lower extremity edema, Disp: 15 tablet, Rfl: 2   gabapentin (NEURONTIN) 300 MG capsule, TAKE 1 CAPSULE (300 MG TOTAL) BY MOUTH 3 (THREE) TIMES DAILY., Disp: 90 capsule, Rfl: 1   meloxicam (MOBIC) 15 MG tablet, TAKE 1 TABLET (15 MG TOTAL) BY MOUTH DAILY., Disp: 30 tablet, Rfl: 2   methocarbamol (ROBAXIN) 500 MG tablet, Take 1 tablet (500 mg total) by mouth every 8 (eight) hours as needed for muscle spasms., Disp: 30 tablet, Rfl: 1   montelukast (SINGULAIR) 10 MG tablet, TAKE 1 TABLET (10 MG TOTAL) BY MOUTH AT BEDTIME., Disp: 30 tablet,  Rfl: 11   nystatin (MYCOSTATIN/NYSTOP) powder, 0PPLY TO AFFECTED AREA 4 TIMES A DAY, Disp: 60 g, Rfl: 5   omeprazole (PRILOSEC) 20 MG capsule, Take 1 capsule (20 mg total) by mouth daily., Disp: 90 capsule, Rfl: 1   rosuvastatin (CRESTOR) 5 MG tablet, Take 1 tablet (5 mg total) by mouth daily., Disp: 90 tablet, Rfl: 1   traMADol (ULTRAM) 50 MG tablet, Take 1 tablet (50 mg total) by mouth every 12 (twelve) hours as needed., Disp: 30 tablet, Rfl: 0   vitamin C (ASCORBIC ACID) 500 MG tablet, Take 500 mg by mouth daily., Disp: , Rfl:    Vitamin D, Ergocalciferol, (DRISDOL) 1.25 MG (50000 UNIT) CAPS capsule, Take 1 capsule (50,000 Units total) by mouth every 7 (seven) days., Disp: 12 capsule, Rfl: 0   Medications ordered in this encounter:  Meds ordered this encounter  Medications   predniSONE (DELTASONE) 20 MG tablet    Sig: Take 2 tablets (40 mg total) by mouth daily with breakfast.    Dispense:  10 tablet    Refill:  0    Order Specific Question:   Supervising Provider    Answer:   Sabra Heck, BRIAN [[id number]]   albuterol (VENTOLIN HFA) 108 (90 Base) MCG/ACT inhaler    Sig: Inhale 1-2 puffs into the lungs every 6 (six) hours as needed for wheezing or shortness of breath.    Dispense:  8 g    Refill:  0  Order Specific Question:   Supervising Provider    Answer:   Noemi Chapel [[id number]]   brompheniramine-pseudoephedrine-DM 30-2-10 MG/5ML syrup    Sig: Take 5 mLs by mouth 4 (four) times daily as needed.    Dispense:  120 mL    Refill:  0    Order Specific Question:   Supervising Provider    Answer:   Sabra Heck, BRIAN [[id number]]     *If you need refills on other medications prior to your next appointment, please contact your pharmacy*  Follow-Up: Call back or seek an in-person evaluation if the symptoms worsen or if the condition fails to improve as anticipated.  Other Instructions Acute Bronchitis, Adult Acute bronchitis is when air tubes in the lungs (bronchi) suddenly get swollen. The condition  can make it hard for you to breathe. In adults, acute bronchitis usually goes away within 2 weeks. A cough caused by bronchitis may last up to 3 weeks. Smoking, allergies, and asthma can make the condition worse. What are the causes? Germs that cause cold and flu (viruses). The most common cause of this condition is the virus that causes the common cold. Bacteria. Substances that bother (irritate) the lungs, including: Smoke from cigarettes and other types of tobacco. Dust and pollen. Fumes from chemicals, gases, or burned fuel. Indoor or outdoor air pollution. What increases the risk? A weak body's defense system. This is also called the immune system. Any condition that affects your lungs and breathing, such as asthma. What are the signs or symptoms? A cough. Coughing up clear, yellow, or green mucus. Making high-pitched whistling sounds when you breathe, most often when you breathe out (wheezing). Runny or stuffy nose. Having too much mucus in your lungs (chest congestion). Shortness of breath. Body aches. A sore throat. How is this treated? Acute bronchitis may go away over time without treatment. Your doctor may tell you to: Drink more fluids. This will help thin your mucus so it is easier to cough up. Use a device that gets medicine into your lungs (inhaler). Use a vaporizer or a humidifier. These are machines that add water to the air. This helps with coughing and poor breathing. Take a medicine that thins mucus and helps clear it from your lungs. Take a medicine that prevents or stops coughing. It is not common to take an antibiotic medicine for this condition. Follow these instructions at home:  Take over-the-counter and prescription medicines only as told by your doctor. Use an inhaler, vaporizer, or humidifier as told by your doctor. Take two teaspoons (10 mL) of honey at bedtime. This helps lessen your coughing at night. Drink enough fluid to keep your pee (urine) pale  yellow. Do not smoke or use any products that contain nicotine or tobacco. If you need help quitting, ask your doctor. Get a lot of rest. Return to your normal activities when your doctor says that it is safe. Keep all follow-up visits. How is this prevented?  Wash your hands often with soap and water for at least 20 seconds. If you cannot use soap and water, use hand sanitizer. Avoid contact with people who have cold symptoms. Try not to touch your mouth, nose, or eyes with your hands. Avoid breathing in smoke or chemical fumes. Make sure to get the flu shot every year. Contact a doctor if: Your symptoms do not get better in 2 weeks. You have trouble coughing up the mucus. Your cough keeps you awake at night. You have a fever. Get help right  away if: You cough up blood. You have chest pain. You have very bad shortness of breath. You faint or keep feeling like you are going to faint. You have a very bad headache. Your fever or chills get worse. These symptoms may be an emergency. Get help right away. Call your local emergency services (911 in the [place] Do not wait to see if the symptoms will go away. Do not drive yourself to the hospital. Summary Acute bronchitis is when air tubes in the lungs (bronchi) suddenly get swollen. In adults, acute bronchitis usually goes away within 2 weeks. Drink more fluids. This will help thin your mucus so it is easier to cough up. Take over-the-counter and prescription medicines only as told by your doctor. Contact a doctor if your symptoms do not improve after 2 weeks of treatment. This information is not intended to replace advice given to you by your health care provider. Make sure you discuss any questions you have with your health care provider. Document Revised: 10/06/2020 Document Reviewed: 10/06/2020 Elsevier Patient Education  2022 [employer].    If you have been instructed to have an in-person evaluation today at a local Urgent Care  facility, please use the link below. It will take you to a list of all of our available [facility], including address, phone number and hours of operation. Please do not delay care.  [facility]  If you or a family member do not have a primary care provider, use the link below to schedule a visit and establish care. When you choose a [facility] primary care physician or advanced practice provider, you gain a long-term partner in health. Find a Primary Care Provider  Learn more about [facility]'s in-office and virtual care options: [facility]

## 2021-05-19 NOTE — Telephone Encounter (Signed)
Pt has congestion, coughing up phlegm, hx of ,bronchitis, would like abx to stop this before it gets worse.  Pt car in the shop, so she has no way to go to an appt  Reason for Disposition . [1] MILD difficulty breathing (e.g., minimal/no SOB at rest, SOB with walking, pulse <100) AND [2] still present when not coughing  Answer Assessment - Initial Assessment Questions 1. ONSET: "When did the cough begin?"      3 days 2. SEVERITY: "How bad is the cough today?"      Spells 3. SPUTUM: "Describe the color of your sputum" (none, dry cough; clear, white, yellow, green)     clear 4. HEMOPTYSIS: "Are you coughing up any blood?" If so ask: "How much?" (flecks, streaks, tablespoons, etc.)     no 5. DIFFICULTY BREATHING: "Are you having difficulty breathing?" If Yes, ask: "How bad is it?" (e.g., mild, moderate, severe)    - MILD: No SOB at rest, mild SOB with walking, speaks normally in sentences, can lie down, no retractions, pulse < 100.    - MODERATE: SOB at rest, SOB with minimal exertion and prefers to sit, cannot lie down flat, speaks in phrases, mild retractions, audible wheezing, pulse 100-120.    - SEVERE: Very SOB at rest, speaks in single words, struggling to breathe, sitting hunched forward, retractions, pulse > 120      mild 6. FEVER: "Do you have a fever?" If Yes, ask: "What is your temperature, how was it measured, and when did it start?"     no 7. CARDIAC HISTORY: "Do you have any history of heart disease?" (e.g., heart attack, congestive heart failure)      no 8. LUNG HISTORY: "Do you have any history of lung disease?"  (e.g., pulmonary embolus, asthma, emphysema)     Hx bronchitis 9. PE RISK FACTORS: "Do you have a history of blood clots?" (or: recent major surgery, recent prolonged travel, bedridden)     na 10. OTHER SYMPTOMS: "Do you have any other symptoms?" (e.g., runny nose, wheezing, chest pain)       No 11. PREGNANCY: "Is there any chance you are pregnant?" "When was your  last menstrual period?"       na 12. TRAVEL: "Have you traveled out of the country in the last month?" (e.g., travel history, exposures)       na  Protocols used: Cough - Acute Productive-A-AH

## 2021-05-20 ENCOUNTER — Other Ambulatory Visit: Payer: Self-pay

## 2021-05-23 ENCOUNTER — Telehealth: Payer: Medicaid Other | Admitting: Family Medicine

## 2021-05-23 ENCOUNTER — Other Ambulatory Visit: Payer: Self-pay

## 2021-05-23 DIAGNOSIS — J0100 Acute maxillary sinusitis, unspecified: Secondary | ICD-10-CM | POA: Diagnosis not present

## 2021-05-23 MED ORDER — AMOXICILLIN-POT CLAVULANATE 875-125 MG PO TABS
1.0000 | ORAL_TABLET | Freq: Two times a day (BID) | ORAL | 0 refills | Status: AC
  Filled 2021-05-23: qty 14, 7d supply, fill #0

## 2021-05-23 NOTE — Patient Instructions (Signed)
I appreciate the opportunity to provide you with care for your health and wellness.  Take medication as directed, complete all of it to prevent reinfection.  Hope you feel better soon!  Happy Holidays!  Please continue to practice social distancing to keep you, your family, and our community safe.  If you must go out, please wear a mask and practice good handwashing.  It was a pleasure to see you and I look forward to continuing to work together on your health and well-being. Please do not hesitate to call the office if you need care or have questions about your care.  Have a wonderful day. With Gratitude, Cherly Beach, DNP, AGNP-BC

## 2021-05-23 NOTE — Progress Notes (Signed)
Virtual Visit Consent   [name], you are scheduled for a virtual visit with a [facility] provider today.     Just as with appointments in the office, your consent must be obtained to participate.  Your consent will be active for this visit and any virtual visit you may have with one of our providers in the next 365 days.     If you have a MyChart account, a copy of this consent can be sent to you electronically.  All virtual visits are billed to your insurance company just like a traditional visit in the office.    As this is a virtual visit, video technology does not allow for your provider to perform a traditional examination.  This may limit your provider's ability to fully assess your condition.  If your provider identifies any concerns that need to be evaluated in person or the need to arrange testing (such as labs, EKG, etc.), we will make arrangements to do so.     Although advances in technology are sophisticated, we cannot ensure that it will always work on either your end or our end.  If the connection with a video visit is poor, the visit may have to be switched to a telephone visit.  With either a video or telephone visit, we are not always able to ensure that we have a secure connection.     I need to obtain your verbal consent now.   Are you willing to proceed with your visit today?    [name] has provided verbal consent on 05/23/2021 for a virtual visit (video or telephone).   [name], NP   Date: 05/23/2021 10:10 AM   Virtual Visit via Video Note   I, [name], connected with  [name]  ([id number], 01-19-1966) on 05/23/21 at 10:20 AM EST by a video-enabled telemedicine application and verified that I am speaking with the correct person using two identifiers. Video turned phone due to tech issues.  Location: Patient: Virtual Visit Location Patient: Home Provider: Virtual Visit Location Provider: Home Office   I discussed the limitations of  evaluation and management by telemedicine and the availability of in person appointments. The patient expressed understanding and agreed to proceed.    History of Present Illness: Kendra Wang is a 55 y.o. who identifies as a female who was assigned female at birth, and is being seen today for on going cough. Was seen on 05/19/21 and Dx with Bronchitis and provided with treatment. At that time she had had 3 days of coughing that was worsening. She was given pred dose pack, inhaler, and cough syrup.   The problem occurs constantly. The cough is Productive of sputum. Associated symptoms include postnasal drip (chronic) and wheezing, nasal congestion, facial pressure.   History of smoking in the past.   Pertinent negatives include no chills, ear congestion, ear pain, fever, headaches, myalgia, sore throat, shortness of breath or sweats. The symptoms are aggravated by lying down. Treatments tried: Nyquil plus the above that was provided at last visit. The treatment provided no relief. Her past medical history is significant for bronchitis and environmental allergies. There is no history of asthma or pneumonia.     Problems:  Patient Active Problem List   Diagnosis Date Noted   Bilateral primary osteoarthritis of knee 08/20/2019   Chronic pain syndrome 08/20/2019   Lumbar facet arthropathy 07/21/2019   Well woman exam with routine gynecological exam 07/09/2019   Complex tear of medial meniscus  of left knee as current injury 03/10/2019   Primary osteoarthritis of left knee 03/10/2019   BMI 45.0-49.9, adult ([place]) 01/20/2019   Well woman exam without gynecological exam 09/24/2017   Cough due to ACE inhibitor 02/06/2017   HLD (hyperlipidemia) 09/19/2016   Obesity 09/02/2014   Vitamin D deficiency 01/31/2010   DEPRESSION 01/31/2010   ALLERGIC RHINITIS 01/31/2010   GERD 01/31/2010   HTN (hypertension) 01/31/2010    Allergies:  Allergies  Allergen Reactions   Ace Inhibitors Cough   Hctz  [Hydrochlorothiazide] Other (See Comments)    Itchy eyes, headache, insomnia   Sulfonamide Derivatives Other (See Comments)     GI upset   Medications:  Current Outpatient Medications:    acetaminophen (TYLENOL) 500 MG tablet, Take 500 mg by mouth every 6 (six) hours as needed (for pain.)., Disp: , Rfl:    albuterol (VENTOLIN HFA) 108 (90 Base) MCG/ACT inhaler, Inhale 1-2 puffs into the lungs every 6 (six) hours as needed for wheezing or shortness of breath., Disp: 18 g, Rfl: 0   amLODipine (NORVASC) 10 MG tablet, Take 1 tablet (10 mg total) by mouth daily., Disp: 90 tablet, Rfl: 1   amoxicillin (AMOXIL) 875 MG tablet, TAKE 1 TABLET (875 MG TOTAL) BY MOUTH 2 (TWO) TIMES DAILY FOR 7 DAYS., Disp: 14 tablet, Rfl: 0   brompheniramine-pseudoephedrine-DM 30-2-10 MG/5ML syrup, Take 5 mLs by mouth 4 (four) times daily as needed., Disp: 120 mL, Rfl: 0   chlorthalidone (HYGROTON) 25 MG tablet, Take 0.5 tablets (12.5 mg total) by mouth daily as needed. Only take as needed for bilateral lower extremity edema, Disp: 15 tablet, Rfl: 2   gabapentin (NEURONTIN) 300 MG capsule, TAKE 1 CAPSULE (300 MG TOTAL) BY MOUTH 3 (THREE) TIMES DAILY., Disp: 90 capsule, Rfl: 1   meloxicam (MOBIC) 15 MG tablet, TAKE 1 TABLET (15 MG TOTAL) BY MOUTH DAILY., Disp: 30 tablet, Rfl: 2   methocarbamol (ROBAXIN) 500 MG tablet, Take 1 tablet (500 mg total) by mouth every 8 (eight) hours as needed for muscle spasms., Disp: 30 tablet, Rfl: 1   montelukast (SINGULAIR) 10 MG tablet, TAKE 1 TABLET (10 MG TOTAL) BY MOUTH AT BEDTIME., Disp: 30 tablet, Rfl: 11   nystatin (MYCOSTATIN/NYSTOP) powder, 0PPLY TO AFFECTED AREA 4 TIMES A DAY, Disp: 60 g, Rfl: 5   omeprazole (PRILOSEC) 20 MG capsule, Take 1 capsule (20 mg total) by mouth daily., Disp: 90 capsule, Rfl: 1   predniSONE (DELTASONE) 20 MG tablet, Take 2 tablets (40 mg total) by mouth daily with breakfast., Disp: 10 tablet, Rfl: 0   rosuvastatin (CRESTOR) 5 MG tablet, Take 1 tablet (5 mg  total) by mouth daily., Disp: 90 tablet, Rfl: 1   traMADol (ULTRAM) 50 MG tablet, Take 1 tablet (50 mg total) by mouth every 12 (twelve) hours as needed., Disp: 30 tablet, Rfl: 0   vitamin C (ASCORBIC ACID) 500 MG tablet, Take 500 mg by mouth daily., Disp: , Rfl:    Vitamin D, Ergocalciferol, (DRISDOL) 1.25 MG (50000 UNIT) CAPS capsule, Take 1 capsule (50,000 Units total) by mouth every 7 (seven) days., Disp: 12 capsule, Rfl: 0  Observations/Objective:  Speech is clear and coherent with logical content.  Patient is alert and oriented at baseline.  Hoarseness   Assessment and Plan:  1. Acute non-recurrent maxillary sinusitis  - amoxicillin-clavulanate (AUGMENTIN) 875-125 MG tablet; Take 1 tablet by mouth 2 (two) times daily for 7 days.  Dispense: 14 tablet; Refill: 0   -On going viral infection presents now  in bacterial range- no improvement with pred pack -Will try Augmentin now. -No worsening breathing/red flags -Did advise if not improved to go be seen by PCP in person   Reviewed side effects, risks and benefits of medication.    Patient acknowledged agreement and understanding of the plan.    I discussed the assessment and treatment plan with the patient. The patient was provided an opportunity to ask questions and all were answered. The patient agreed with the plan and demonstrated an understanding of the instructions.   The patient was advised to call back or seek an in-person evaluation if the symptoms worsen or if the condition fails to improve as anticipated.   The above assessment and management plan was discussed with the patient. The patient verbalized understanding of and has agreed to the management plan. Patient is aware to call the clinic if symptoms persist or worsen. Patient is aware when to return to the clinic for a follow-up visit. Patient educated on when it is appropriate to go to the emergency department.    Follow Up Instructions: I discussed the  assessment and treatment plan with the patient. The patient was provided an opportunity to ask questions and all were answered. The patient agreed with the plan and demonstrated an understanding of the instructions.  A copy of instructions were sent to the patient via MyChart unless otherwise noted below.     The patient was advised to call back or seek an in-person evaluation if the symptoms worsen or if the condition fails to improve as anticipated.  Time:  I spent 15 minutes with the patient via telehealth technology discussing the above problems/concerns.    [name], NP

## 2021-05-30 ENCOUNTER — Other Ambulatory Visit: Payer: Self-pay

## 2021-05-31 ENCOUNTER — Other Ambulatory Visit: Payer: Self-pay

## 2021-06-24 ENCOUNTER — Other Ambulatory Visit: Payer: Self-pay

## 2021-06-24 ENCOUNTER — Other Ambulatory Visit: Payer: Self-pay | Admitting: Surgical

## 2021-06-24 MED FILL — Nystatin Topical Powder 100000 Unit/GM: CUTANEOUS | 15 days supply | Qty: 60 | Fill #0 | Status: AC

## 2021-06-24 MED FILL — Nystatin Topical Powder 100000 Unit/GM: CUTANEOUS | 30 days supply | Qty: 60 | Fill #0 | Status: CN

## 2021-06-25 ENCOUNTER — Other Ambulatory Visit: Payer: Self-pay

## 2021-06-27 ENCOUNTER — Other Ambulatory Visit: Payer: Self-pay

## 2021-06-28 ENCOUNTER — Other Ambulatory Visit: Payer: Self-pay

## 2021-06-29 ENCOUNTER — Other Ambulatory Visit: Payer: Self-pay

## 2021-07-06 ENCOUNTER — Other Ambulatory Visit: Payer: Self-pay

## 2021-07-07 ENCOUNTER — Emergency Department: Payer: Medicaid Other

## 2021-07-07 ENCOUNTER — Other Ambulatory Visit: Payer: Self-pay

## 2021-07-07 ENCOUNTER — Emergency Department
Admission: EM | Admit: 2021-07-07 | Discharge: 2021-07-07 | Disposition: A | Discharge status: Home / Self Care | Payer: Medicaid Other | Attending: Emergency Medicine | Admitting: Emergency Medicine

## 2021-07-07 DIAGNOSIS — I2510 Atherosclerotic heart disease of native coronary artery without angina pectoris: Secondary | ICD-10-CM | POA: Diagnosis not present

## 2021-07-07 DIAGNOSIS — W228XXA Striking against or struck by other objects, initial encounter: Secondary | ICD-10-CM | POA: Insufficient documentation

## 2021-07-07 DIAGNOSIS — I10 Essential (primary) hypertension: Secondary | ICD-10-CM | POA: Insufficient documentation

## 2021-07-07 DIAGNOSIS — S90121A Contusion of right lesser toe(s) without damage to nail, initial encounter: Secondary | ICD-10-CM | POA: Insufficient documentation

## 2021-07-07 DIAGNOSIS — S99921A Unspecified injury of right foot, initial encounter: Secondary | ICD-10-CM | POA: Diagnosis present

## 2021-07-07 NOTE — Discharge Instructions (Signed)
Follow-up with your primary care provider if any continued problems.  Ice and elevation to reduce swelling for your toe.  Wear the postop splint on your foot until you are able to walk without pain to your fourth toe.  Continue taking your meloxicam and you may take additional Tylenol with this if additional pain medication is needed.

## 2021-07-07 NOTE — ED Notes (Signed)
See triage note  presents with pain to right foot  states she stubbed her right 4 th toe on dresser couple of days ago

## 2021-07-07 NOTE — ED Triage Notes (Signed)
Pt c/o right 4th toe pain for the past couple days after stumping on a dresser

## 2021-07-07 NOTE — ED Provider Notes (Signed)
[facility] Provider Note    Event Date/Time   First MD Initiated Contact with Patient 07/07/21 [phone]     (approximate)   History   Toe Pain   HPI  Kendra Wang is a 56 y.o. female   presents to the ED with complaint of right fourth toe pain for the last several days after she stomped her toe on a dresser.  Patient has been taking Tylenol but has continued to have pain.  She states that in the past she has had a fracture further up on her foot which sounds like possibly fifth metatarsal fracture but no further issues with her foot prior to this injury.  Patient also has a history of hypertension, arthritis, depression, CAD and GERD.  She currently takes meloxicam for her arthritis.      Physical Exam   Triage Vital Signs: ED Triage Vitals  Enc Vitals Group     BP 07/07/21 0838 134/74     Pulse Rate 07/07/21 0838 81     Resp 07/07/21 0838 20     Temp 07/07/21 0838 98 F (36.7 C)     Temp Source 07/07/21 0838 Oral     SpO2 07/07/21 0838 100 %     Weight 07/07/21 0852 253 lb 8.5 oz (115 kg)     Height 07/07/21 0852 5\' 2"  (1.575 m)     Head Circumference --      Peak Flow --      Pain Score --      Pain Loc --      Pain Edu? --      Excl. in [place]? --     Most recent vital signs: Vitals:   07/07/21 0838  BP: 134/74  Pulse: 81  Resp: 20  Temp: 98 F (36.7 C)  SpO2: 100%     General: Awake, no distress.  CV:  Good peripheral perfusion.  Heart regular rate and rhythm. Resp:  Normal effort.  Lungs are clear bilaterally. Abd:  No distention.  Other:  On examination of the right foot the fourth digit is edematous, skin is intact, motor sensory function intact.  DP pulse is present.  No edema or discoloration is noted to the base of the fifth metatarsal.   ED Results / Procedures / Treatments   Labs (all labs ordered are listed, but only abnormal results are displayed) Labs Reviewed - No data to display    RADIOLOGY  X-ray of the  right foot was reviewed by myself and no fractures were seen.  I reviewed the radiology report who also reported no fractures noted.    PROCEDURES:  Critical Care performed: No  Procedures   MEDICATIONS ORDERED IN ED: Medications - No data to display   IMPRESSION / MDM / [place] / ED COURSE  I reviewed the triage vital signs and the nursing notes.   Differential diagnosis includes, but is not limited to, contusion right foot, contusion right fourth digit or fracture right fourth digit of the right foot.   56 year old female presents to the ED with complaint of right fourth digit pain of her foot after bumping it into some furniture.  Is continued to remain swollen.  Patient has been taking some Tylenol with minimal relief.  Physical exam does show that the fourth digit is edematous without skin disruption or discoloration.  X-ray of the right foot was reviewed by myself and negative for fracture.  Review of the radiology report also agrees that  there is no fracture.  Patient was made aware.  She was placed in a postop shoe for support and protection.  Patient will continue taking her meloxicam which she takes daily for her arthritis and add Tylenol if additional pain medication is needed.  She is encouraged to ice and elevate to help reduce some of the swelling.  She will follow-up with her PCP if any continued problems.   FINAL CLINICAL IMPRESSION(S) / ED DIAGNOSES   Final diagnoses:  Contusion of fourth toe of right foot, initial encounter     Rx / DC Orders   ED Discharge Orders     None        Note:  This document was prepared using Dragon voice recognition software and may include unintentional dictation errors.   Johnn Hai, PA-C 07/07/21 1117    Vladimir Crofts, MD 07/07/21 1438

## 2021-07-23 ENCOUNTER — Other Ambulatory Visit: Payer: Self-pay

## 2021-07-23 ENCOUNTER — Emergency Department
Admission: EM | Admit: 2021-07-23 | Discharge: 2021-07-23 | Disposition: A | Discharge status: Hospice | Payer: Medicare HMO | Attending: Emergency Medicine | Admitting: Emergency Medicine

## 2021-07-23 ENCOUNTER — Encounter: Payer: Self-pay | Admitting: Emergency Medicine

## 2021-07-23 DIAGNOSIS — K0889 Other specified disorders of teeth and supporting structures: Secondary | ICD-10-CM | POA: Diagnosis not present

## 2021-07-23 DIAGNOSIS — H9202 Otalgia, left ear: Secondary | ICD-10-CM | POA: Diagnosis not present

## 2021-07-23 DIAGNOSIS — I10 Essential (primary) hypertension: Secondary | ICD-10-CM | POA: Insufficient documentation

## 2021-07-23 MED ORDER — AMOXICILLIN 500 MG PO TABS: 500.0000 mg | ORAL_TABLET | Freq: Three times a day (TID) | ORAL | 0 refills | Status: DC

## 2021-07-23 MED ORDER — TRAMADOL HCL 50 MG PO TABS: 50.0000 mg | ORAL_TABLET | Freq: Four times a day (QID) | ORAL | 0 refills | Status: DC | PRN

## 2021-07-23 NOTE — ED Provider Notes (Signed)
[facility] Provider Note    Event Date/Time   First MD Initiated Contact with Patient 07/23/21 [phone]     (approximate)   History   Otalgia and Sore Throat   HPI  Kendra Wang is a 56 y.o. female with history of hypertension, GERD, anxiety and as listed in EMR presents to the emergency department for treatment and evaluation of left ear pain, left side facial pain that radiates into her neck. Symptoms started a week ago. She has a bad tooth and is scheduled to see the dentist on Monday. No fever, cough, congestion.       Physical Exam   Triage Vital Signs: ED Triage Vitals  Enc Vitals Group     BP 07/23/21 0828 (!) 145/80     Pulse Rate 07/23/21 0828 84     Resp 07/23/21 0828 16     Temp 07/23/21 0828 98 F (36.7 C)     Temp Source 07/23/21 0828 Oral     SpO2 07/23/21 0828 98 %     Weight 07/23/21 0823 253 lb 8.5 oz (115 kg)     Height 07/23/21 0823 5\' 2"  (1.575 m)     Head Circumference --      Peak Flow --      Pain Score 07/23/21 0823 7     Pain Loc --      Pain Edu? --      Excl. in [place]? --     Most recent vital signs: Vitals:   07/23/21 0828  BP: (!) 145/80  Pulse: 84  Resp: 16  Temp: 98 F (36.7 C)  SpO2: 98%    General: Awake, no distress.  CV:  Good peripheral perfusion.  Resp:  Normal effort. Breath sounds clear.  Abd:  No distention. Other:  Left TM normal. Left upper molar with obvious dental decay.    ED Results / Procedures / Treatments   Labs (all labs ordered are listed, but only abnormal results are displayed) Labs Reviewed - No data to display   EKG  Not indicated.   RADIOLOGY  Image and radiology report reviewed by me.  Not indicated.  PROCEDURES:  Critical Care performed: No  Procedures   MEDICATIONS ORDERED IN ED: Medications - No data to display   IMPRESSION / MDM / [place] / ED COURSE  I reviewed the triage vital signs and the nursing notes.                               Differential diagnosis includes, but is not limited to, otitis media, dental abscess  56 year old female presenting for evaluation of left ear pain and dental pain. See HPI.   On exam, TM is without infection. Symptoms are likely dental decay related. Will cover with amoxicillin and give a few tablets of tramadol. She was encouraged to keep her scheduled dental appointment on Monday.  If her symptoms change or worsen if she is unable to be evaluated by primary care or her dentist, she is to return to the emergency department.      FINAL CLINICAL IMPRESSION(S) / ED DIAGNOSES   Final diagnoses:  Acute pain of left ear  Pain, dental     Rx / DC Orders   ED Discharge Orders          Ordered    amoxicillin (AMOXIL) 500 MG tablet  3 times daily  07/23/21 0855    traMADol (ULTRAM) 50 MG tablet  Every 6 hours PRN        07/23/21 0855             Note:  This document was prepared using Dragon voice recognition software and may include unintentional dictation errors.   Victorino Dike, FNP 07/23/21 1207    Lavonia Drafts, MD 07/23/21 1226

## 2021-07-23 NOTE — ED Triage Notes (Signed)
Pt reports left ear pain and sore throat for the last couple of days. Pt denies fevers, SOB, bodyaches or recent exposures

## 2021-07-27 DIAGNOSIS — R69 Illness, unspecified: Secondary | ICD-10-CM | POA: Diagnosis not present

## 2021-08-01 ENCOUNTER — Ambulatory Visit: Payer: Medicaid Other | Admitting: Nurse Practitioner

## 2021-08-01 ENCOUNTER — Other Ambulatory Visit: Payer: Self-pay

## 2021-08-01 ENCOUNTER — Ambulatory Visit
Admission: RE | Admit: 2021-08-01 | Discharge: 2021-08-01 | Disposition: A | Discharge status: Home / Self Care | Payer: Medicare HMO | Source: Ambulatory Visit | Attending: Neurosurgery | Admitting: Neurosurgery

## 2021-08-01 DIAGNOSIS — D329 Benign neoplasm of meninges, unspecified: Secondary | ICD-10-CM | POA: Diagnosis not present

## 2021-08-01 MED ORDER — GADOBUTROL 1 MMOL/ML IV SOLN
10.0000 mL | Freq: Once | INTRAVENOUS | Status: AC | PRN
  Administered 2021-08-01: 10 mL via INTRAVENOUS

## 2021-08-09 DIAGNOSIS — D329 Benign neoplasm of meninges, unspecified: Secondary | ICD-10-CM | POA: Diagnosis not present

## 2021-08-15 ENCOUNTER — Ambulatory Visit (INDEPENDENT_AMBULATORY_CARE_PROVIDER_SITE_OTHER): Payer: Medicare HMO | Admitting: Family Medicine

## 2021-08-15 ENCOUNTER — Encounter: Payer: Self-pay | Admitting: Family Medicine

## 2021-08-15 ENCOUNTER — Other Ambulatory Visit: Payer: Self-pay

## 2021-08-15 VITALS — BP 140/82 | HR 92 | Temp 98.2°F | Ht 60.5 in | Wt 247.2 lb

## 2021-08-15 DIAGNOSIS — E559 Vitamin D deficiency, unspecified: Secondary | ICD-10-CM | POA: Diagnosis not present

## 2021-08-15 DIAGNOSIS — I10 Essential (primary) hypertension: Secondary | ICD-10-CM

## 2021-08-15 DIAGNOSIS — M47816 Spondylosis without myelopathy or radiculopathy, lumbar region: Secondary | ICD-10-CM

## 2021-08-15 DIAGNOSIS — M170 Bilateral primary osteoarthritis of knee: Secondary | ICD-10-CM

## 2021-08-15 DIAGNOSIS — E785 Hyperlipidemia, unspecified: Secondary | ICD-10-CM

## 2021-08-15 DIAGNOSIS — M7989 Other specified soft tissue disorders: Secondary | ICD-10-CM | POA: Diagnosis not present

## 2021-08-15 DIAGNOSIS — M5481 Occipital neuralgia: Secondary | ICD-10-CM

## 2021-08-15 DIAGNOSIS — R7303 Prediabetes: Secondary | ICD-10-CM | POA: Diagnosis not present

## 2021-08-15 DIAGNOSIS — M1712 Unilateral primary osteoarthritis, left knee: Secondary | ICD-10-CM | POA: Diagnosis not present

## 2021-08-15 DIAGNOSIS — R0982 Postnasal drip: Secondary | ICD-10-CM

## 2021-08-15 MED ORDER — FLUTICASONE PROPIONATE 50 MCG/ACT NA SUSP: 2.0000 | Freq: Every day | NASAL | 6 refills | Status: AC

## 2021-08-15 MED ORDER — CHLORTHALIDONE 25 MG PO TABS: 12.5000 mg | ORAL_TABLET | Freq: Every day | ORAL | 2 refills | Status: DC

## 2021-08-15 MED ORDER — METHOCARBAMOL 500 MG PO TABS: 500.0000 mg | ORAL_TABLET | Freq: Three times a day (TID) | ORAL | 1 refills | Status: DC | PRN

## 2021-08-15 MED ORDER — GABAPENTIN 300 MG PO CAPS: ORAL_CAPSULE | Freq: Three times a day (TID) | ORAL | 1 refills | Status: DC

## 2021-08-15 NOTE — Patient Instructions (Addendum)
#  Neck pain/Knee pain - consider physical therapy  - call back or mychart for referral  #Sore throat - Try flonase for drainage - take Singulair daily  #Blood pressure - start Chlorithalidone daily - continue the Amlodipine - return for fasting labs in 1-2 weeks  Return for tramadol refill when needed   Reach out if you would like nutrition referral  Check with insurance - [employer] or [employer]   Metformin for prediabetes can also help with some weight loss

## 2021-08-15 NOTE — Assessment & Plan Note (Signed)
BP slightly elevated. Cont amlodipine 10 mg. Start chlorithalidone 12.5 mg daily. Return for labs in 1-2 weeks.

## 2021-08-15 NOTE — Progress Notes (Signed)
Subjective:     Kendra Wang is a 56 y.o. female presenting for Establish Care (Will request records from OB/GYN )     HPI  #Neck pain - also some throat pain associated - purse carried on that - has tried her robaxin w/o success  #osteoarthritis - taking meloxicam as needed - tramadol 50 mg reports daily - ran out of insurance - planning to see ortho - has new insurance with gym membership - planning to start stationary bike  Review of Systems   Social History   Tobacco Use  Smoking Status Every Day   Packs/day: 0.50   Years: 4.00   Pack years: 2.00   Types: Cigarettes  Smokeless Tobacco Never        Objective:    BP Readings from Last 3 Encounters:  08/15/21 140/82  07/23/21 (!) 145/80  07/07/21 134/74   Wt Readings from Last 3 Encounters:  08/15/21 247 lb 3 oz (112.1 kg)  07/23/21 253 lb 8.5 oz (115 kg)  07/07/21 253 lb 8.5 oz (115 kg)    BP 140/82    Pulse 92    Temp 98.2 F (36.8 C) (Oral)    Ht 5' 0.5" (1.537 m)    Wt 247 lb 3 oz (112.1 kg)    SpO2 100%    BMI 47.48 kg/m    Physical Exam Constitutional:      General: She is not in acute distress.    Appearance: She is well-developed. She is obese. She is not diaphoretic.  HENT:     Head: Normocephalic and atraumatic.     Right Ear: Tympanic membrane and external ear normal.     Left Ear: Tympanic membrane and external ear normal.     Nose: Nose normal.     Mouth/Throat:     Mouth: Mucous membranes are moist.     Pharynx: Posterior oropharyngeal erythema present.  Eyes:     Conjunctiva/sclera: Conjunctivae normal.  Cardiovascular:     Rate and Rhythm: Normal rate and regular rhythm.     Heart sounds: No murmur heard. Pulmonary:     Effort: Pulmonary effort is normal. No respiratory distress.     Breath sounds: Normal breath sounds. No wheezing.  Musculoskeletal:     Cervical back: Normal range of motion and neck supple. Tenderness (left lateral neck) present. No rigidity.   Lymphadenopathy:     Cervical: No cervical adenopathy.  Skin:    General: Skin is warm and dry.     Capillary Refill: Capillary refill takes less than 2 seconds.  Neurological:     Mental Status: She is alert. Mental status is at baseline.  Psychiatric:        Mood and Affect: Mood normal.        Behavior: Behavior normal.          Assessment & Plan:   Problem List Items Addressed This Visit       Cardiovascular and Mediastinum   HTN (hypertension)    BP slightly elevated. Cont amlodipine 10 mg. Start chlorithalidone 12.5 mg daily. Return for labs in 1-2 weeks.       Relevant Medications   chlorthalidone (HYGROTON) 25 MG tablet     Musculoskeletal and Integument   Lumbar facet arthropathy - Primary   Primary osteoarthritis of left knee   Relevant Medications   meloxicam (MOBIC) 15 MG tablet   gabapentin (NEURONTIN) 300 MG capsule   methocarbamol (ROBAXIN) 500 MG tablet   Bilateral primary osteoarthritis of  knee    Worsening.  Patient lost insurance but is planning to reconnect with orthopedic about possible surgery.  At this time symptoms relatively controlled on meloxicam 15 mg daily, tramadol 50 mg once daily, Robaxin 500 mg as needed.  Discussed she would need return visit if she wanted additional tramadol 1 pill/day seems reasonable till she is able to get surgery.  Encourage stationary bike offered PT but she declined at this time      Relevant Medications   meloxicam (MOBIC) 15 MG tablet   methocarbamol (ROBAXIN) 500 MG tablet     Other   Vitamin D deficiency    Has not been taking supplement. Notes weight gain, will reassess      Relevant Orders   Vitamin D, 25-hydroxy   HLD (hyperlipidemia)   Relevant Medications   chlorthalidone (HYGROTON) 25 MG tablet   Other Relevant Orders   Comprehensive metabolic panel   Lipid panel   Occipital neuralgia    Stable with gabapentin 300 mg TID      Post-nasal drip    Suspect sore throat is 2/2 to post nasal  drip. Start Flonase. Cont singulair.       Relevant Medications   fluticasone (FLONASE) 50 MCG/ACT nasal spray   Other Visit Diagnoses     Essential hypertension       Relevant Medications   chlorthalidone (HYGROTON) 25 MG tablet   Other Relevant Orders   Comprehensive metabolic panel   Left leg swelling       Relevant Medications   chlorthalidone (HYGROTON) 25 MG tablet   Prediabetes       Relevant Orders   Hemoglobin A1c        Return in about 6 weeks (around 09/26/2021) for blood pressure .  Lesleigh Noe, MD  This visit occurred during the SARS-CoV-2 public health emergency.  Safety protocols were in place, including screening questions prior to the visit, additional usage of staff PPE, and extensive cleaning of exam room while observing appropriate contact time as indicated for disinfecting solutions.

## 2021-08-15 NOTE — Assessment & Plan Note (Signed)
Has not been taking supplement. Notes weight gain, will reassess

## 2021-08-15 NOTE — Assessment & Plan Note (Signed)
Worsening.  Patient lost insurance but is planning to reconnect with orthopedic about possible surgery.  At this time symptoms relatively controlled on meloxicam 15 mg daily, tramadol 50 mg once daily, Robaxin 500 mg as needed.  Discussed she would need return visit if she wanted additional tramadol 1 pill/day seems reasonable till she is able to get surgery.  Encourage stationary bike offered PT but she declined at this time

## 2021-08-15 NOTE — Assessment & Plan Note (Signed)
Stable with gabapentin 300 mg TID

## 2021-08-15 NOTE — Assessment & Plan Note (Signed)
Suspect sore throat is 2/2 to post nasal drip. Start Flonase. Cont singulair.

## 2021-08-19 ENCOUNTER — Other Ambulatory Visit (INDEPENDENT_AMBULATORY_CARE_PROVIDER_SITE_OTHER): Payer: Medicare HMO

## 2021-08-19 ENCOUNTER — Other Ambulatory Visit: Payer: Self-pay

## 2021-08-19 DIAGNOSIS — R7303 Prediabetes: Secondary | ICD-10-CM | POA: Diagnosis not present

## 2021-08-19 DIAGNOSIS — E785 Hyperlipidemia, unspecified: Secondary | ICD-10-CM

## 2021-08-19 DIAGNOSIS — I10 Essential (primary) hypertension: Secondary | ICD-10-CM | POA: Diagnosis not present

## 2021-08-19 DIAGNOSIS — E559 Vitamin D deficiency, unspecified: Secondary | ICD-10-CM | POA: Diagnosis not present

## 2021-08-19 LAB — COMPREHENSIVE METABOLIC PANEL
ALT: 13 U/L (ref 0–35)
AST: 17 U/L (ref 0–37)
Albumin: 4 g/dL (ref 3.5–5.2)
Alkaline Phosphatase: 111 U/L (ref 39–117)
BUN: 9 mg/dL (ref 6–23)
CO2: 28 mEq/L (ref 19–32)
Calcium: 9.1 mg/dL (ref 8.4–10.5)
Chloride: 105 mEq/L (ref 96–112)
Creatinine, Ser: 0.74 mg/dL (ref 0.40–1.20)
GFR: 90.63 mL/min (ref >60.00)
Glucose, Bld: 86 mg/dL (ref 70–99)
Potassium: 4.2 mEq/L (ref 3.5–5.1)
Sodium: 141 mEq/L (ref 135–145)
Total Bilirubin: 0.5 mg/dL (ref 0.2–1.2)
Total Protein: 6.8 g/dL (ref 6.0–8.3)

## 2021-08-19 LAB — LIPID PANEL
Cholesterol: 222 mg/dL — ABNORMAL HIGH (ref 0–200)
HDL: 45.7 mg/dL (ref >39.00)
LDL Cholesterol: 146 mg/dL — ABNORMAL HIGH (ref 0–99)
NonHDL: 176.01
Total CHOL/HDL Ratio: 5
Triglycerides: 148 mg/dL (ref 0.0–149.0)
VLDL: 29.6 mg/dL (ref 0.0–40.0)

## 2021-08-19 LAB — HEMOGLOBIN A1C: Hgb A1c MFr Bld: 6 % (ref 4.6–6.5)

## 2021-08-19 LAB — VITAMIN D 25 HYDROXY (VIT D DEFICIENCY, FRACTURES): VITD: 23.27 ng/mL — ABNORMAL LOW (ref 30.00–100.00)

## 2021-08-22 ENCOUNTER — Other Ambulatory Visit: Payer: Self-pay | Admitting: Family Medicine

## 2021-08-22 DIAGNOSIS — E559 Vitamin D deficiency, unspecified: Secondary | ICD-10-CM

## 2021-08-22 MED ORDER — CHOLECALCIFEROL 1.25 MG (50000 UT) PO TABS: 1.0000 | ORAL_TABLET | ORAL | 1 refills | Status: DC

## 2021-09-15 ENCOUNTER — Other Ambulatory Visit: Payer: Self-pay | Admitting: Family Medicine

## 2021-09-15 ENCOUNTER — Encounter: Payer: Self-pay | Admitting: Family Medicine

## 2021-09-15 DIAGNOSIS — Z1231 Encounter for screening mammogram for malignant neoplasm of breast: Secondary | ICD-10-CM

## 2021-09-26 ENCOUNTER — Ambulatory Visit (INDEPENDENT_AMBULATORY_CARE_PROVIDER_SITE_OTHER): Payer: Medicare HMO | Admitting: Family Medicine

## 2021-09-26 ENCOUNTER — Encounter: Payer: Self-pay | Admitting: Family Medicine

## 2021-09-26 VITALS — BP 128/84 | HR 92 | Ht 63.0 in | Wt 239.6 lb

## 2021-09-26 DIAGNOSIS — Z87898 Personal history of other specified conditions: Secondary | ICD-10-CM | POA: Diagnosis not present

## 2021-09-26 DIAGNOSIS — G8929 Other chronic pain: Secondary | ICD-10-CM

## 2021-09-26 DIAGNOSIS — I10 Essential (primary) hypertension: Secondary | ICD-10-CM

## 2021-09-26 DIAGNOSIS — M549 Dorsalgia, unspecified: Secondary | ICD-10-CM | POA: Diagnosis not present

## 2021-09-26 DIAGNOSIS — J309 Allergic rhinitis, unspecified: Secondary | ICD-10-CM

## 2021-09-26 DIAGNOSIS — M170 Bilateral primary osteoarthritis of knee: Secondary | ICD-10-CM

## 2021-09-26 DIAGNOSIS — M7989 Other specified soft tissue disorders: Secondary | ICD-10-CM

## 2021-09-26 DIAGNOSIS — E7800 Pure hypercholesterolemia, unspecified: Secondary | ICD-10-CM

## 2021-09-26 MED ORDER — ROSUVASTATIN CALCIUM 10 MG PO TABS: 10.0000 mg | ORAL_TABLET | Freq: Every day | ORAL | 0 refills | Status: DC

## 2021-09-26 MED ORDER — MONTELUKAST SODIUM 10 MG PO TABS: ORAL_TABLET | Freq: Every day | ORAL | 3 refills | Status: DC

## 2021-09-26 MED ORDER — CHLORTHALIDONE 25 MG PO TABS: 12.5000 mg | ORAL_TABLET | Freq: Every day | ORAL | 0 refills | Status: DC

## 2021-09-26 MED ORDER — TRAMADOL HCL 50 MG PO TABS: 50.0000 mg | ORAL_TABLET | Freq: Every day | ORAL | 0 refills | Status: DC | PRN

## 2021-09-26 MED ORDER — AMLODIPINE BESYLATE 10 MG PO TABS: 10.0000 mg | ORAL_TABLET | Freq: Every day | ORAL | 1 refills | Status: DC

## 2021-09-26 MED ORDER — MELOXICAM 15 MG PO TABS: 15.0000 mg | ORAL_TABLET | Freq: Every day | ORAL | 0 refills | Status: DC

## 2021-09-26 NOTE — Patient Instructions (Addendum)
Blood pressure ?- Continue Amlodipine 10 mg ?- Continue Chlorthalidone 12.5 mg ? ?Cholesterol ?- increase medication Crestor to 10 mg ?- monitor for worsening leg cramps ? ?Knee and back pain ?- continue as needed meloxicam and tramadol ?- physical therapy referral ? ?

## 2021-09-26 NOTE — Assessment & Plan Note (Signed)
Chronic knee pain, anticipating surgery in the future.  Discussed okay for as needed meloxicam and tramadol as well as risks associated with these.  Advised physical therapy she will check with insurance to see if she can afford this. ?

## 2021-09-26 NOTE — Assessment & Plan Note (Signed)
Stable continue singular 10 mg daily ?

## 2021-09-26 NOTE — Assessment & Plan Note (Signed)
Blood pressure is improved.  However patient has been taking too much amlodipine.  Continue amlodipine 10 mg 1 pill daily.  Continue chlorthalidone 12.5 mg daily.  Update if blood pressure elevated at home. ?

## 2021-09-26 NOTE — Progress Notes (Signed)
? ?Subjective:  ? ?  ?Kendra Wang is a 56 y.o. female presenting for Follow-up and Pain Management (6 week follow up for blood pressure ,pt is has taking '10mg'$  because  filled her meds  wrong pt is now taking '20mg'$ . Pt  needs refill on her cholesterol medication, mobic ) ?  ? ? ?HPI ? ?#HTN ?- Amlodipine has been taking 20 mg recently ?- Chlorithalidone 12.5 mg ?- does not have a cuff ? ?#Knee pain ?- weight is down ?- is ultimately planning surgery - but trying to delay ?- has silver sneakers planning to joint a gym ?- weight is down ? ?Review of Systems ? ? ?Social History  ? ?Tobacco Use  ?Smoking Status Every Day  ? Packs/day: 0.50  ? Years: 4.00  ? Pack years: 2.00  ? Types: Cigarettes  ?Smokeless Tobacco Never  ? ? ? ?   ?Objective:  ?  ?BP Readings from Last 3 Encounters:  ?09/26/21 128/84  ?08/15/21 140/82  ?07/23/21 (!) 145/80  ? ?Wt Readings from Last 3 Encounters:  ?09/26/21 239 lb 9.6 oz (108.7 kg)  ?08/15/21 247 lb 3 oz (112.1 kg)  ?07/23/21 253 lb 8.5 oz (115 kg)  ? ? ?BP 128/84   Pulse 92   Ht '5\' 3"'$  (1.6 m)   Wt 239 lb 9.6 oz (108.7 kg)   SpO2 96%   BMI 42.44 kg/m?  ? ? ?Physical Exam ?Constitutional:   ?   General: She is not in acute distress. ?   Appearance: She is well-developed. She is obese. She is not diaphoretic.  ?HENT:  ?   Right Ear: External ear normal.  ?   Left Ear: External ear normal.  ?Eyes:  ?   Conjunctiva/sclera: Conjunctivae normal.  ?Cardiovascular:  ?   Rate and Rhythm: Normal rate and regular rhythm.  ?   Heart sounds: No murmur heard. ?Pulmonary:  ?   Effort: Pulmonary effort is normal. No respiratory distress.  ?   Breath sounds: Normal breath sounds. No wheezing.  ?Musculoskeletal:  ?   Cervical back: Neck supple.  ?Skin: ?   General: Skin is warm and dry.  ?   Capillary Refill: Capillary refill takes less than 2 seconds.  ?Neurological:  ?   Mental Status: She is alert. Mental status is at baseline.  ?Psychiatric:     ?   Mood and Affect: Mood normal.     ?    Behavior: Behavior normal.  ? ? ? ? ? ?   ?Assessment & Plan:  ? ?Problem List Items Addressed This Visit   ? ?  ? Cardiovascular and Mediastinum  ? HTN (hypertension) - Primary  ?  Blood pressure is improved.  However patient has been taking too much amlodipine.  Continue amlodipine 10 mg 1 pill daily.  Continue chlorthalidone 12.5 mg daily.  Update if blood pressure elevated at home. ?  ?  ? Relevant Medications  ? amLODipine (NORVASC) 10 MG tablet  ? chlorthalidone (HYGROTON) 25 MG tablet  ? rosuvastatin (CRESTOR) 10 MG tablet  ?  ? Respiratory  ? Allergic rhinitis  ?  Stable continue singular 10 mg daily ?  ?  ?  ? Musculoskeletal and Integument  ? Bilateral primary osteoarthritis of knee  ?  Chronic knee pain, anticipating surgery in the future.  Discussed okay for as needed meloxicam and tramadol as well as risks associated with these.  Advised physical therapy she will check with insurance to see if she can afford  this. ?  ?  ? Relevant Medications  ? meloxicam (MOBIC) 15 MG tablet  ? traMADol (ULTRAM) 50 MG tablet  ? Other Relevant Orders  ? Ambulatory referral to Physical Therapy  ?  ? Other  ? HLD (hyperlipidemia)  ?  Not at goal, increase Crestor. ?  ?  ? Relevant Medications  ? amLODipine (NORVASC) 10 MG tablet  ? chlorthalidone (HYGROTON) 25 MG tablet  ? rosuvastatin (CRESTOR) 10 MG tablet  ? ?Other Visit Diagnoses   ? ? Essential hypertension      ? Relevant Medications  ? amLODipine (NORVASC) 10 MG tablet  ? chlorthalidone (HYGROTON) 25 MG tablet  ? rosuvastatin (CRESTOR) 10 MG tablet  ? Left leg swelling      ? Relevant Medications  ? chlorthalidone (HYGROTON) 25 MG tablet  ? History of chronic cough      ? Relevant Medications  ? montelukast (SINGULAIR) 10 MG tablet  ? Chronic bilateral back pain, unspecified back location      ? Relevant Medications  ? meloxicam (MOBIC) 15 MG tablet  ? traMADol (ULTRAM) 50 MG tablet  ? Other Relevant Orders  ? Ambulatory referral to Physical Therapy  ? ?   ? ? ? ?Return in about 6 months (around 03/28/2022) for Welcome to medicare wellness visit. ? ?Lesleigh Noe, MD ? ? ? ?

## 2021-09-26 NOTE — Assessment & Plan Note (Signed)
Not at goal, increase Crestor. ?

## 2021-09-27 IMAGING — CT CT RENAL STONE PROTOCOL
2 of 4 series · 17 of 46 positions shown, 19 images · non-contrast
Comparison: 11/26/2014 complete abdominal ultrasound.

CLINICAL DATA: Right flank pain.

EXAM:
CT ABDOMEN AND PELVIS WITHOUT CONTRAST
TECHNIQUE: Multidetector CT imaging of the abdomen and pelvis was performed
following the standard protocol without IV contrast.

[Series 2: stone full standard · axial · 0.68mm/px · z∈[-470,-50]mm · 14 of 93 slices shown, 16 images]
[im 5/93  soft-tissue]
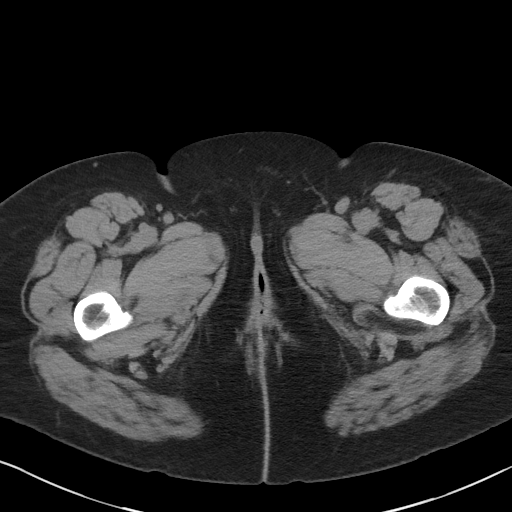
[im 5/93  bone]
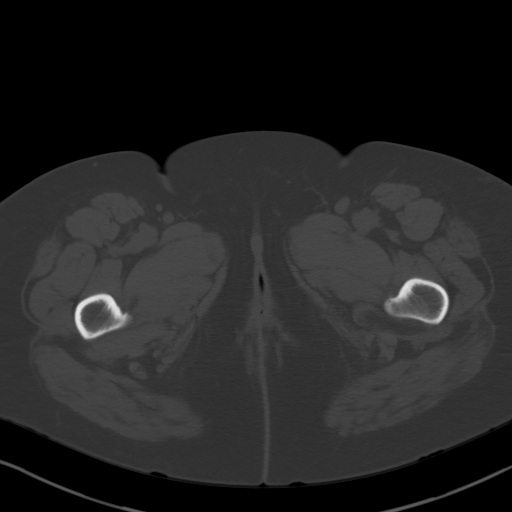
[im 13/93  soft-tissue]
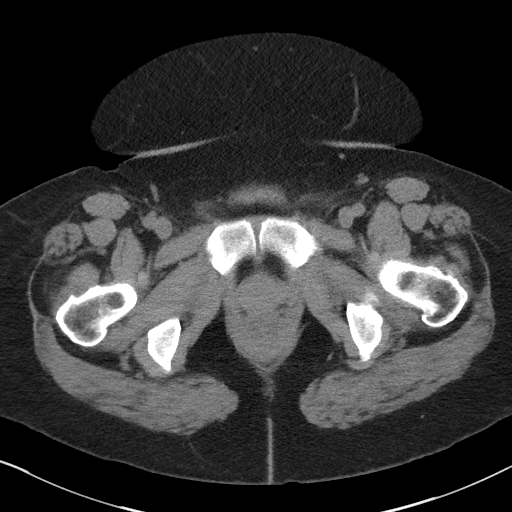
[im 17/93  soft-tissue]
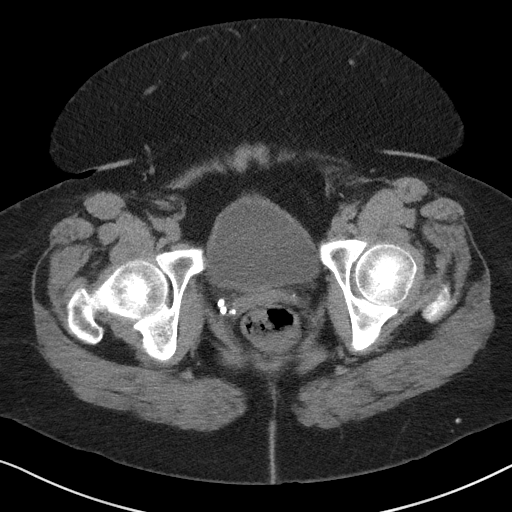
[im 25/93  soft-tissue]
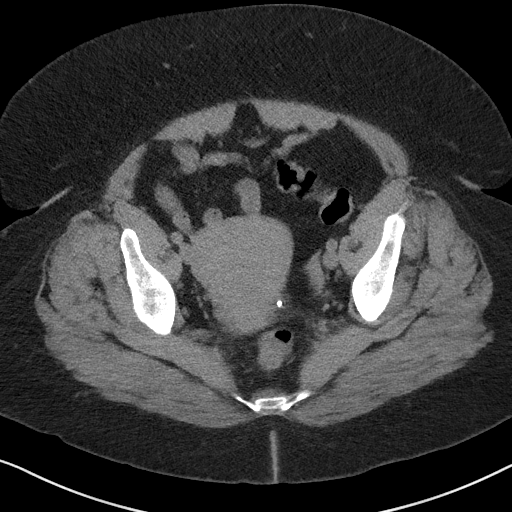
[im 33/93  soft-tissue]
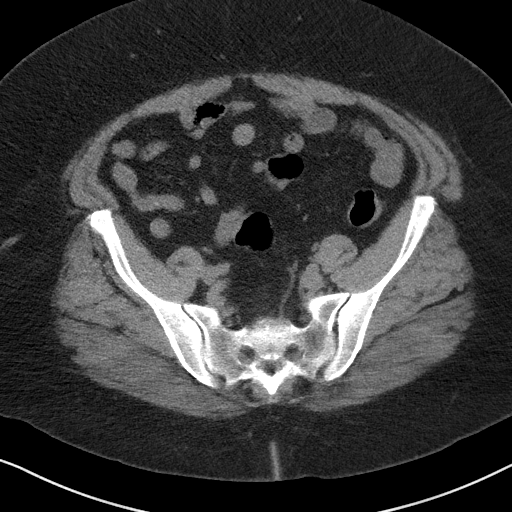
[im 37/93  soft-tissue]
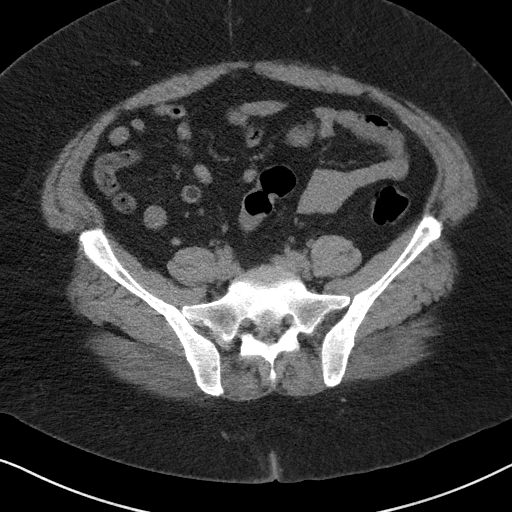
[im 45/93  soft-tissue]
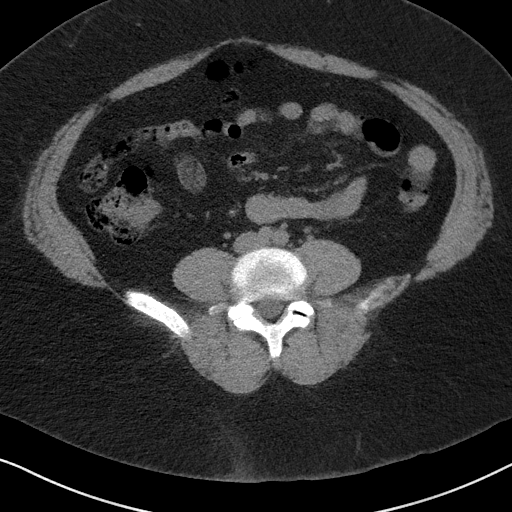
[im 49/93  soft-tissue]
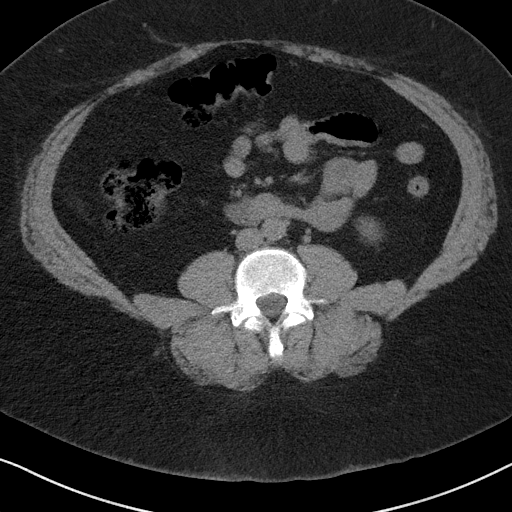
[im 57/93  soft-tissue]
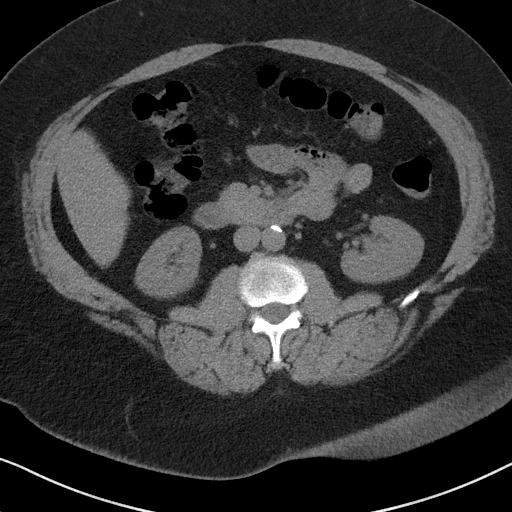
[im 57/93  bone]
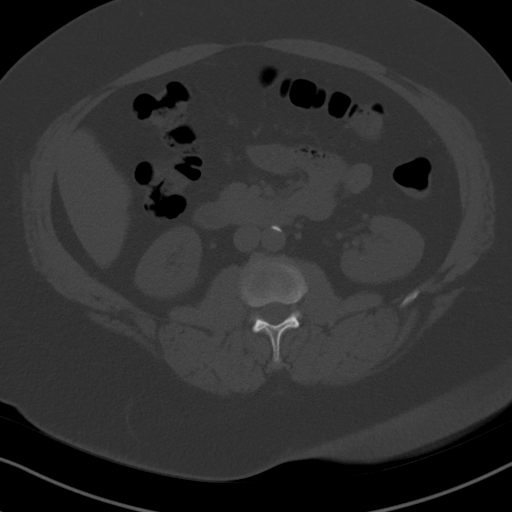
[im 61/93  soft-tissue]
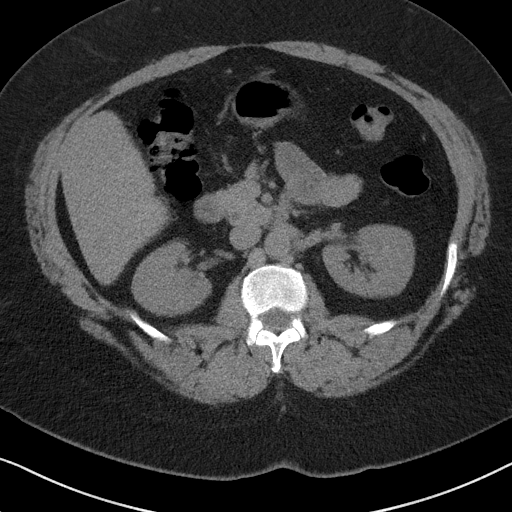
[im 69/93  soft-tissue]
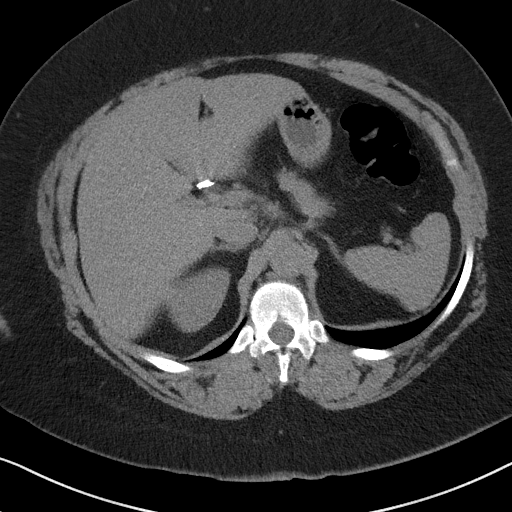
[im 77/93  soft-tissue]
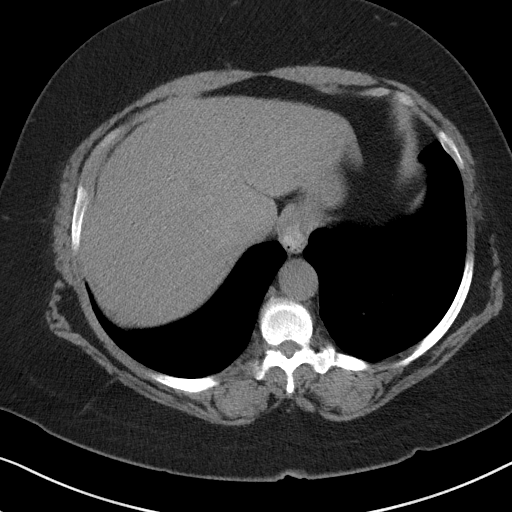
[im 81/93  soft-tissue]
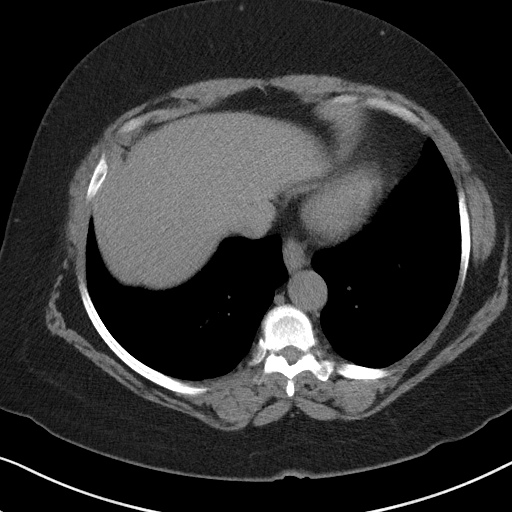
[im 89/93  soft-tissue]
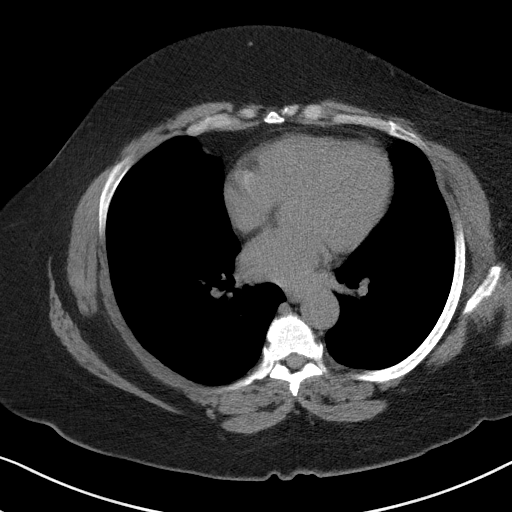

[Series 5: coronal · coronal · 0.86mm/px · 3 of 177 slices shown]
[im 59/177  soft-tissue]
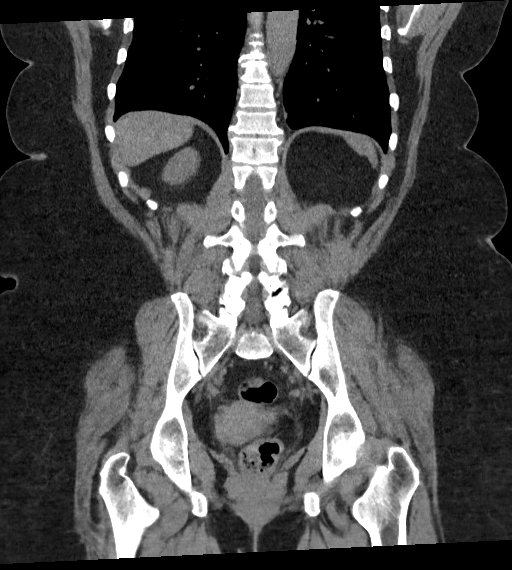
[im 79/177  soft-tissue]
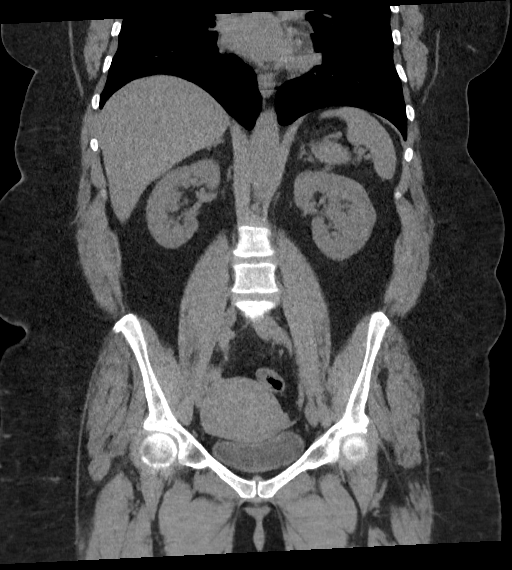
[im 98/177  soft-tissue]
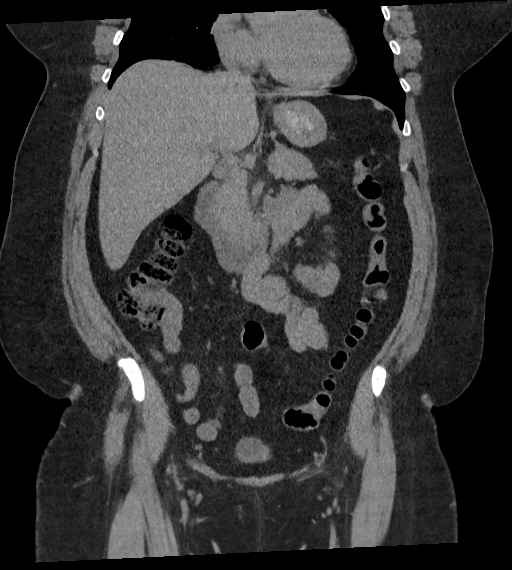

[17 of 46 positions shown; findings below may reference images not displayed]

FINDINGS: Please note that lack of intravenous contrast limits evaluation of
the viscera and vasculature.

Lower chest: Minimal bibasilar atelectasis.

Hepatobiliary: No focal hepatic lesion. No biliary dilatation.
Gallbladder is surgically absent.

Pancreas: No focal lesions or pancreatic ductal dilatation. No
surrounding inflammation.

Spleen: Unremarkable.

Adrenals/Urinary Tract: Adrenal glands are unremarkable. No focal
renal lesion or calculi. No hydronephrosis. Bladder is partially
decompressed with no intraluminal calculi. Adjacent pelvic
phleboliths.

Stomach/Bowel: Stomach is within normal limits. Appendix appears
normal. No evidence of obstruction. No bowel wall thickening or
inflammatory changes. No ascites.

Vascular/Lymphatic: Vasculature is within normal limits for
patient's age. Aortic atherosclerotic calcifications. No
abdominopelvic adenopathy.

Reproductive: Unremarkable.

Other: External soft tissues are unremarkable.

Musculoskeletal: No acute or significant osseous findings. Mild
multilevel spondylosis.
IMPRESSION: No evidence of nephrolithiasis or hydronephrosis. Calcific densities
within the pelvis reflect phleboliths.

No acute abdominopelvic process.

Sequela of cholecystectomy.

## 2021-09-28 DIAGNOSIS — M47816 Spondylosis without myelopathy or radiculopathy, lumbar region: Secondary | ICD-10-CM | POA: Diagnosis not present

## 2021-09-28 DIAGNOSIS — G6289 Other specified polyneuropathies: Secondary | ICD-10-CM | POA: Diagnosis not present

## 2021-09-28 DIAGNOSIS — G8929 Other chronic pain: Secondary | ICD-10-CM | POA: Diagnosis not present

## 2021-09-28 DIAGNOSIS — M4316 Spondylolisthesis, lumbar region: Secondary | ICD-10-CM | POA: Diagnosis not present

## 2021-09-28 DIAGNOSIS — M5416 Radiculopathy, lumbar region: Secondary | ICD-10-CM | POA: Diagnosis not present

## 2021-09-28 DIAGNOSIS — Z6841 Body Mass Index (BMI) 40.0 and over, adult: Secondary | ICD-10-CM | POA: Diagnosis not present

## 2021-09-28 DIAGNOSIS — M5441 Lumbago with sciatica, right side: Secondary | ICD-10-CM | POA: Diagnosis not present

## 2021-10-20 ENCOUNTER — Ambulatory Visit
Admission: RE | Admit: 2021-10-20 | Discharge: 2021-10-20 | Disposition: A | Discharge status: Home / Self Care | Payer: Medicare HMO | Source: Ambulatory Visit | Attending: Family Medicine | Admitting: Family Medicine

## 2021-10-20 ENCOUNTER — Encounter: Payer: Self-pay | Admitting: Radiology

## 2021-10-20 DIAGNOSIS — Z1231 Encounter for screening mammogram for malignant neoplasm of breast: Secondary | ICD-10-CM | POA: Diagnosis not present

## 2021-10-27 DIAGNOSIS — J019 Acute sinusitis, unspecified: Secondary | ICD-10-CM | POA: Diagnosis not present

## 2021-10-27 DIAGNOSIS — B9689 Other specified bacterial agents as the cause of diseases classified elsewhere: Secondary | ICD-10-CM | POA: Diagnosis not present

## 2021-10-28 DIAGNOSIS — M1712 Unilateral primary osteoarthritis, left knee: Secondary | ICD-10-CM | POA: Diagnosis not present

## 2021-11-07 DIAGNOSIS — R200 Anesthesia of skin: Secondary | ICD-10-CM | POA: Diagnosis not present

## 2021-11-07 DIAGNOSIS — R202 Paresthesia of skin: Secondary | ICD-10-CM | POA: Diagnosis not present

## 2021-11-09 DIAGNOSIS — Z0100 Encounter for examination of eyes and vision without abnormal findings: Secondary | ICD-10-CM | POA: Diagnosis not present

## 2021-11-09 DIAGNOSIS — H524 Presbyopia: Secondary | ICD-10-CM | POA: Diagnosis not present

## 2021-11-17 ENCOUNTER — Other Ambulatory Visit: Payer: Self-pay | Admitting: Family Medicine

## 2021-12-01 ENCOUNTER — Telehealth: Payer: Self-pay

## 2021-12-01 NOTE — Telephone Encounter (Signed)
ERROR

## 2022-01-02 ENCOUNTER — Other Ambulatory Visit: Payer: Self-pay | Admitting: Family Medicine

## 2022-01-02 DIAGNOSIS — E7800 Pure hypercholesterolemia, unspecified: Secondary | ICD-10-CM

## 2022-02-03 ENCOUNTER — Ambulatory Visit (INDEPENDENT_AMBULATORY_CARE_PROVIDER_SITE_OTHER): Payer: Medicare HMO | Admitting: Family Medicine

## 2022-02-03 VITALS — BP 100/60 | HR 85 | Temp 97.7°F | Ht 63.0 in | Wt 226.4 lb

## 2022-02-03 DIAGNOSIS — J309 Allergic rhinitis, unspecified: Secondary | ICD-10-CM | POA: Diagnosis not present

## 2022-02-03 DIAGNOSIS — I10 Essential (primary) hypertension: Secondary | ICD-10-CM | POA: Diagnosis not present

## 2022-02-03 DIAGNOSIS — E7800 Pure hypercholesterolemia, unspecified: Secondary | ICD-10-CM

## 2022-02-03 DIAGNOSIS — B372 Candidiasis of skin and nail: Secondary | ICD-10-CM | POA: Diagnosis not present

## 2022-02-03 DIAGNOSIS — N61 Mastitis without abscess: Secondary | ICD-10-CM

## 2022-02-03 DIAGNOSIS — K219 Gastro-esophageal reflux disease without esophagitis: Secondary | ICD-10-CM | POA: Diagnosis not present

## 2022-02-03 DIAGNOSIS — M7989 Other specified soft tissue disorders: Secondary | ICD-10-CM | POA: Diagnosis not present

## 2022-02-03 DIAGNOSIS — M1712 Unilateral primary osteoarthritis, left knee: Secondary | ICD-10-CM | POA: Diagnosis not present

## 2022-02-03 DIAGNOSIS — M7522 Bicipital tendinitis, left shoulder: Secondary | ICD-10-CM | POA: Diagnosis not present

## 2022-02-03 DIAGNOSIS — M7542 Impingement syndrome of left shoulder: Secondary | ICD-10-CM | POA: Diagnosis not present

## 2022-02-03 DIAGNOSIS — Z6841 Body Mass Index (BMI) 40.0 and over, adult: Secondary | ICD-10-CM | POA: Diagnosis not present

## 2022-02-03 DIAGNOSIS — M7582 Other shoulder lesions, left shoulder: Secondary | ICD-10-CM | POA: Diagnosis not present

## 2022-02-03 DIAGNOSIS — R7303 Prediabetes: Secondary | ICD-10-CM

## 2022-02-03 MED ORDER — CHLORTHALIDONE 25 MG PO TABS: 12.5000 mg | ORAL_TABLET | Freq: Every day | ORAL | 0 refills | Status: DC

## 2022-02-03 MED ORDER — MELOXICAM 15 MG PO TABS
15.0000 mg | ORAL_TABLET | Freq: Every day | ORAL | 0 refills | Status: DC
Start: 2022-02-03 — End: 2023-07-12

## 2022-02-03 MED ORDER — OMEPRAZOLE 20 MG PO CPDR: 20.0000 mg | DELAYED_RELEASE_CAPSULE | Freq: Every day | ORAL | 0 refills | Status: DC

## 2022-02-03 MED ORDER — AMLODIPINE BESYLATE 10 MG PO TABS: 10.0000 mg | ORAL_TABLET | Freq: Every day | ORAL | 1 refills | Status: DC

## 2022-02-03 MED ORDER — CEPHALEXIN 500 MG PO CAPS: 500.0000 mg | ORAL_CAPSULE | Freq: Four times a day (QID) | ORAL | 0 refills | Status: AC

## 2022-02-03 MED ORDER — LEVOCETIRIZINE DIHYDROCHLORIDE 5 MG PO TABS: 5.0000 mg | ORAL_TABLET | Freq: Every evening | ORAL | 0 refills | Status: DC

## 2022-02-03 MED ORDER — NYSTATIN 100000 UNIT/GM EX POWD
1.0000 | Freq: Three times a day (TID) | CUTANEOUS | 1 refills | Status: DC
Start: 2022-02-03 — End: 2023-07-12

## 2022-02-03 MED ORDER — ROSUVASTATIN CALCIUM 10 MG PO TABS: 10.0000 mg | ORAL_TABLET | Freq: Every day | ORAL | 0 refills | Status: DC

## 2022-02-03 MED ORDER — GABAPENTIN 300 MG PO CAPS: ORAL_CAPSULE | Freq: Three times a day (TID) | ORAL | 1 refills | Status: DC

## 2022-02-03 MED ORDER — METHOCARBAMOL 500 MG PO TABS: 500.0000 mg | ORAL_TABLET | Freq: Three times a day (TID) | ORAL | 1 refills | Status: DC | PRN

## 2022-02-03 NOTE — Assessment & Plan Note (Addendum)
Patient would like to try Xyzal 5 mg.  Stop Singulair.  Update in 1 month with response.

## 2022-02-03 NOTE — Assessment & Plan Note (Addendum)
Blood pressure at goal.  Continue amlodipine 10 mg, chlorthalidone 12.5 mg.

## 2022-02-03 NOTE — Assessment & Plan Note (Signed)
Recheck today, continue Crestor 10 mg.

## 2022-02-03 NOTE — Assessment & Plan Note (Signed)
Continue gabapentin 300 mg 3 times daily, Mobic 15 mg as needed, Robaxin as needed.

## 2022-02-03 NOTE — Assessment & Plan Note (Signed)
Continue omeprazole 20 mg 

## 2022-02-03 NOTE — Patient Instructions (Signed)
Let me know if you like the new allergy medicine better  Try the oral antibiotics for the nipple If no improvement see your GYN

## 2022-02-03 NOTE — Assessment & Plan Note (Signed)
She had a normal mammogram in May.  There is some mild erythema on the left nipple.  Burning pain discussed trial of antibiotics.  Discussed ultrasound, but no masses and she notes on and off burning since her last mammogram which was normal.  If symptoms do not resolve with a course of antibiotics advise follow-up with OB/GYN for reevaluation.

## 2022-02-03 NOTE — Progress Notes (Signed)
Subjective:     Kendra Wang is a 56 y.o. female presenting for Breast Pain (Stinging around L nipple x 2 weeks ) and Rash (Heat rash under both breasts. Requesting nystatin powder. )     Rash    #Rash - heat rash for a long time - using nystatin powder for this with help - would like refill  #Nipple - left pain - stinging and burning sensation  - off and on - no discharge - no skin changes - no lumps in the breast that she felt - nerve pain sensation - going on for a few months  - worse over the last 2 weeks - once or twice a day - lasts for a few seconds - did see something on the nipple  Review of Systems  Skin:  Positive for rash.     Social History   Tobacco Use  Smoking Status Every Day   Packs/day: 0.50   Years: 4.00   Total pack years: 2.00   Types: Cigarettes  Smokeless Tobacco Never        Objective:    BP Readings from Last 3 Encounters:  02/03/22 100/60  09/26/21 128/84  08/15/21 140/82   Wt Readings from Last 3 Encounters:  02/03/22 226 lb 6 oz (102.7 kg)  09/26/21 239 lb 9.6 oz (108.7 kg)  08/15/21 247 lb 3 oz (112.1 kg)    BP 100/60   Pulse 85   Temp 97.7 F (36.5 C) (Temporal)   Ht '5\' 3"'$  (1.6 m)   Wt 226 lb 6 oz (102.7 kg)   LMP  (LMP Unknown)   SpO2 98%   BMI 40.10 kg/m    Physical Exam Exam conducted with a chaperone present.  Constitutional:      General: She is not in acute distress.    Appearance: She is well-developed. She is not diaphoretic.  HENT:     Right Ear: External ear normal.     Left Ear: External ear normal.     Nose: Nose normal.  Eyes:     Conjunctiva/sclera: Conjunctivae normal.  Cardiovascular:     Rate and Rhythm: Normal rate.  Pulmonary:     Effort: Pulmonary effort is normal.  Chest:  Breasts:    Breasts are symmetrical.     Right: No nipple discharge.     Left: Skin change (3 mm raised erythematous papule on the left nipple) present. No inverted nipple, nipple discharge or  tenderness.     Comments: Darkening of skin under b/l breasts with satellite border Musculoskeletal:     Cervical back: Neck supple.  Skin:    General: Skin is warm and dry.     Capillary Refill: Capillary refill takes less than 2 seconds.  Neurological:     Mental Status: She is alert. Mental status is at baseline.  Psychiatric:        Mood and Affect: Mood normal.        Behavior: Behavior normal.           Assessment & Plan:   Problem List Items Addressed This Visit       Cardiovascular and Mediastinum   HTN (hypertension)    Blood pressure at goal.  Continue amlodipine 10 mg, chlorthalidone 12.5 mg.      Relevant Medications   amLODipine (NORVASC) 10 MG tablet   chlorthalidone (HYGROTON) 25 MG tablet   rosuvastatin (CRESTOR) 10 MG tablet     Respiratory   Allergic rhinitis    Patient  would like to try Xyzal 5 mg.  Stop Singulair.  Update in 1 month with response.      Relevant Medications   levocetirizine (XYZAL) 5 MG tablet     Digestive   GERD    Continue omeprazole 20 mg.      Relevant Medications   omeprazole (PRILOSEC) 20 MG capsule     Musculoskeletal and Integument   Primary osteoarthritis of left knee    Continue gabapentin 300 mg 3 times daily, Mobic 15 mg as needed, Robaxin as needed.      Relevant Medications   methocarbamol (ROBAXIN) 500 MG tablet   gabapentin (NEURONTIN) 300 MG capsule   meloxicam (MOBIC) 15 MG tablet   Candidal intertrigo    Under bilateral breasts, response to nystatin powder.  Worse in the summer.  Refill today.      Relevant Medications   nystatin (MYCOSTATIN/NYSTOP) powder   cephALEXin (KEFLEX) 500 MG capsule     Other   HLD (hyperlipidemia)    Recheck today, continue Crestor 10 mg.      Relevant Medications   amLODipine (NORVASC) 10 MG tablet   chlorthalidone (HYGROTON) 25 MG tablet   rosuvastatin (CRESTOR) 10 MG tablet   Other Relevant Orders   Lipid panel   BMI 45.0-49.9, adult ([place])   Cellulitis  of left breast - Primary    She had a normal mammogram in May.  There is some mild erythema on the left nipple.  Burning pain discussed trial of antibiotics.  Discussed ultrasound, but no masses and she notes on and off burning since her last mammogram which was normal.  If symptoms do not resolve with a course of antibiotics advise follow-up with OB/GYN for reevaluation.      Relevant Medications   cephALEXin (KEFLEX) 500 MG capsule   Prediabetes   Relevant Orders   Hemoglobin A1c   Other Visit Diagnoses     Essential hypertension       Relevant Medications   amLODipine (NORVASC) 10 MG tablet   chlorthalidone (HYGROTON) 25 MG tablet   rosuvastatin (CRESTOR) 10 MG tablet   Left leg swelling       Relevant Medications   chlorthalidone (HYGROTON) 25 MG tablet        Return if symptoms worsen or fail to improve.  Lesleigh Noe, MD

## 2022-02-03 NOTE — Assessment & Plan Note (Signed)
Under bilateral breasts, response to nystatin powder.  Worse in the summer.  Refill today.

## 2022-02-06 ENCOUNTER — Telehealth: Payer: Self-pay | Admitting: Radiology

## 2022-02-06 ENCOUNTER — Telehealth: Payer: Self-pay

## 2022-02-06 NOTE — Telephone Encounter (Signed)
Patient notified that labs were needed on Friday, she had another appt scheduled. She will have her other Dr. Lazaro Arms labs.

## 2022-02-06 NOTE — Telephone Encounter (Signed)
Prior auth started for Nystop 100000 UNIT/GM powder. [name] [employer] - Rx #: [id number] Per [employer] My [employer], this PA has been archived with unknown outcome.  I called [facility] to see why this had been archived by them.  They stated the Rx was run through with [employer], so they cancelled the PA.

## 2022-02-06 NOTE — Addendum Note (Signed)
Addended by: Ellamae Sia on: 02/06/2022 02:22 PM   Modules accepted: Orders

## 2022-02-06 NOTE — Telephone Encounter (Signed)
Noted  

## 2022-02-10 ENCOUNTER — Telehealth: Payer: Self-pay

## 2022-02-10 NOTE — Telephone Encounter (Signed)
Patient is calling in stating insurance is needing a PA for nystatin (MYCOSTATIN/NYSTOP) powder as they are denying the coverage.

## 2022-02-10 NOTE — Telephone Encounter (Signed)
I attempted to renew prior auth online for Nystop 100000 UNIT/GM powder. [name] Key: [place] My Meds states:  Authorization already on file for this request. I called [facility] to see if they could send back through the prescription so I could start a new PA for this medication.  Per Wells Guiles at [facility], they show that the PA has been approved and the cost of the medication is $1.45.  I called patient to notify her, she understands.

## 2022-02-10 NOTE — Telephone Encounter (Signed)
  I attempted to renew prior auth online for Nystop 100000 UNIT/GM powder. [name] Key: [place] [employer] states:  Authorization already on file for this request. I called [facility] to see if they could send back through the prescription so I could start a new PA for this medication.  Per Wells Guiles at [facility], they show that the PA has been approved and the cost of the medication is $1.45.  I called patient to notify her, she understands.

## 2022-02-18 DIAGNOSIS — D1779 Benign lipomatous neoplasm of other sites: Secondary | ICD-10-CM | POA: Diagnosis not present

## 2022-02-18 DIAGNOSIS — R051 Acute cough: Secondary | ICD-10-CM | POA: Diagnosis not present

## 2022-02-18 DIAGNOSIS — J069 Acute upper respiratory infection, unspecified: Secondary | ICD-10-CM | POA: Diagnosis not present

## 2022-02-23 ENCOUNTER — Ambulatory Visit (INDEPENDENT_AMBULATORY_CARE_PROVIDER_SITE_OTHER): Payer: Medicare HMO | Admitting: Family Medicine

## 2022-02-23 ENCOUNTER — Encounter: Payer: Self-pay | Admitting: Family Medicine

## 2022-02-23 VITALS — BP 100/70 | HR 98 | Temp 97.7°F | Wt 223.5 lb

## 2022-02-23 DIAGNOSIS — J0140 Acute pansinusitis, unspecified: Secondary | ICD-10-CM | POA: Diagnosis not present

## 2022-02-23 DIAGNOSIS — L089 Local infection of the skin and subcutaneous tissue, unspecified: Secondary | ICD-10-CM

## 2022-02-23 DIAGNOSIS — J309 Allergic rhinitis, unspecified: Secondary | ICD-10-CM

## 2022-02-23 DIAGNOSIS — L723 Sebaceous cyst: Secondary | ICD-10-CM

## 2022-02-23 MED ORDER — AMOXICILLIN-POT CLAVULANATE 875-125 MG PO TABS: 1.0000 | ORAL_TABLET | Freq: Two times a day (BID) | ORAL | 0 refills | Status: AC

## 2022-02-23 MED ORDER — LEVOCETIRIZINE DIHYDROCHLORIDE 5 MG PO TABS: 5.0000 mg | ORAL_TABLET | Freq: Every evening | ORAL | 1 refills | Status: DC

## 2022-02-23 NOTE — Assessment & Plan Note (Signed)
On doxycycline with persistent sinus symptoms. Complete doxy course and start Augmentin if no improvement

## 2022-02-23 NOTE — Assessment & Plan Note (Signed)
I&D performed with prurlent fluid drained. However, discussed with patient given lack of additional fluctuance and size of lesion would recommend surgical excision and referral to surgery placed. Prior to I&D perceived to be much smaller.

## 2022-02-23 NOTE — Progress Notes (Signed)
Subjective:     Noga Fogg is a 56 y.o. female presenting for Mass (Size of a quarter, On R hip. Has grown over last couple weeks and has now started draining. )     HPI  #Mass - was present for years - the size of a pea - has grown of the last few weeks - got red, sore swelling, had some pus drainage - cannot sleep on that side due to pain - with pressing on it it gets some drainage -   #sinus infection - not getting better - still with headaches and cough - usually gets better with augmentin On antibiotics for cough On doxycycline  Review of Systems  Constitutional:  Negative for chills and fever.     Social History   Tobacco Use  Smoking Status Former   Packs/day: 0.50   Years: 4.00   Total pack years: 2.00   Types: Cigarettes   Quit date: 01/17/2022   Years since quitting: 0.1  Smokeless Tobacco Never        Objective:    BP Readings from Last 3 Encounters:  02/23/22 100/70  02/03/22 100/60  09/26/21 128/84   Wt Readings from Last 3 Encounters:  02/23/22 223 lb 8 oz (101.4 kg)  02/03/22 226 lb 6 oz (102.7 kg)  09/26/21 239 lb 9.6 oz (108.7 kg)    BP 100/70   Pulse 98   Temp 97.7 F (36.5 C) (Temporal)   Wt 223 lb 8 oz (101.4 kg)   LMP  (LMP Unknown)   SpO2 99%   BMI 39.59 kg/m    Physical Exam Constitutional:      General: She is not in acute distress.    Appearance: She is well-developed. She is not diaphoretic.  HENT:     Right Ear: External ear normal.     Left Ear: External ear normal.     Nose: Nose normal.     Right Sinus: No maxillary sinus tenderness or frontal sinus tenderness.     Left Sinus: No maxillary sinus tenderness or frontal sinus tenderness.     Mouth/Throat:     Mouth: Mucous membranes are moist.  Eyes:     Conjunctiva/sclera: Conjunctivae normal.     Pupils: Pupils are equal, round, and reactive to light.  Cardiovascular:     Rate and Rhythm: Normal rate.  Pulmonary:     Effort: Pulmonary effort is  normal.  Musculoskeletal:     Cervical back: Neck supple.  Skin:    General: Skin is warm and dry.     Capillary Refill: Capillary refill takes less than 2 seconds.     Comments: Right posterior hip with quarter sized area of erythema draining prurlent fluid overlaying a 2-3 cm sized firm mass.   Neurological:     Mental Status: She is alert. Mental status is at baseline.  Psychiatric:        Mood and Affect: Mood normal.        Behavior: Behavior normal.           Assessment & Plan:   Problem List Items Addressed This Visit       Respiratory   Allergic rhinitis    Refill of xyzal.       Relevant Medications   levocetirizine (XYZAL) 5 MG tablet   Acute non-recurrent pansinusitis    On doxycycline with persistent sinus symptoms. Complete doxy course and start Augmentin if no improvement      Relevant Medications  doxycycline (VIBRAMYCIN) 100 MG capsule   Azelastine HCl 137 MCG/SPRAY SOLN   guaiFENesin-codeine 100-10 MG/5ML syrup   levocetirizine (XYZAL) 5 MG tablet   amoxicillin-clavulanate (AUGMENTIN) 875-125 MG tablet     Musculoskeletal and Integument   Infected sebaceous cyst - Primary    I&D performed with prurlent fluid drained. However, discussed with patient given lack of additional fluctuance and size of lesion would recommend surgical excision and referral to surgery placed. Prior to I&D perceived to be much smaller.       Relevant Orders   Ambulatory referral to General Surgery   Procedure: I&D Indication: infected sebaceous cyst  Risks: Discussed with patient risk for bleeding, infection, scar  Verbal consent obtained from patient. Discussed risks and benefits and questions answered. Chaperone present Yes.   Equipment: 11 blade, curved hemostats  Medications and dosage and quantity: 2 ml of 1% lidocaine with epi  Patient cleaned and draped. Lidocaine injected with good result. Small incision made with 11 blade with immediate drainage of  purulent fluid followed by sebaceous material. Exploration with hemostat without any additional purulent fluid drained. Large firm cyst remaining.   Procedure was uncomplicated and patient tolerated it well.   Discussed signs of worsening infection. Already on doxycycline for 3 days more.      Return if symptoms worsen or fail to improve.  Lesleigh Noe, MD

## 2022-02-23 NOTE — Assessment & Plan Note (Signed)
Refill of xyzal.

## 2022-02-23 NOTE — Patient Instructions (Signed)
May have some drainage  Referral to surgery  Continue doxycycline Start augmentin if sinus symptoms persist  Update if redness returns after you finish doxycycline  Return if worsening redness, fever, chills, increased size or fluid collection.

## 2022-03-08 ENCOUNTER — Ambulatory Visit: Payer: Medicare HMO | Admitting: Surgery

## 2022-03-28 ENCOUNTER — Encounter: Payer: Medicaid Other | Admitting: Family Medicine

## 2022-04-22 ENCOUNTER — Emergency Department
Admission: EM | Admit: 2022-04-22 | Discharge: 2022-04-22 | Disposition: A | Discharge status: Home / Self Care | Payer: Medicare HMO | Attending: Emergency Medicine | Admitting: Emergency Medicine

## 2022-04-22 ENCOUNTER — Other Ambulatory Visit: Payer: Self-pay

## 2022-04-22 DIAGNOSIS — I10 Essential (primary) hypertension: Secondary | ICD-10-CM | POA: Diagnosis not present

## 2022-04-22 NOTE — ED Provider Notes (Signed)
   [facility] Provider Note    Event Date/Time   First MD Initiated Contact with Patient 04/22/22 1303     (approximate)   History   Hypertension   HPI  Kendra Wang is a 56 y.o. female with history of high blood pressure who presents for blood pressure check.  Patient reports she was visiting a family member who had very high blood pressure and she felt that she needed to have her blood pressure checked.  She does take medication for her blood pressure.  Typically well controlled, no neurodeficits, no headache.     Physical Exam   Triage Vital Signs: ED Triage Vitals  Enc Vitals Group     BP 04/22/22 1253 (!) 149/91     Pulse Rate 04/22/22 1253 (!) 106     Resp 04/22/22 1253 20     Temp 04/22/22 1253 98.5 F (36.9 C)     Temp src --      SpO2 04/22/22 1253 99 %     Weight 04/22/22 1253 104.3 kg (230 lb)     Height 04/22/22 1253 1.575 m ('5\' 2"'$ )     Head Circumference --      Peak Flow --      Pain Score 04/22/22 1320 0     Pain Loc --      Pain Edu? --      Excl. in [place]? --     Most recent vital signs: Vitals:   04/22/22 1253  BP: (!) 149/91  Pulse: (!) 106  Resp: 20  Temp: 98.5 F (36.9 C)  SpO2: 99%     General: Awake, no distress.  CV:  Good peripheral perfusion.  Resp:  Normal effort.  Abd:  No distention.  Other:     ED Results / Procedures / Treatments   Labs (all labs ordered are listed, but only abnormal results are displayed) Labs Reviewed - No data to display   EKG     RADIOLOGY     PROCEDURES:  Critical Care performed:   Procedures   MEDICATIONS ORDERED IN ED: Medications - No data to display   IMPRESSION / MDM / [place] / ED COURSE  I reviewed the triage vital signs and the nursing notes. Patient's presentation is most consistent with exacerbation of chronic illness.   Patient with a history of high blood pressure presents for blood pressure check.  Overall she states that she  is feeling somewhat nervous given family member with very high blood pressure.  No neurodeficits, well-appearing reassuring exam.  Blood pressure mildly elevated, typically well controlled, she will follow-up with her PCP, recheck at home.       FINAL CLINICAL IMPRESSION(S) / ED DIAGNOSES   Final diagnoses:  Primary hypertension     Rx / DC Orders   ED Discharge Orders     None        Note:  This document was prepared using Dragon voice recognition software and may include unintentional dictation errors.   Lavonia Drafts, MD 04/22/22 1547

## 2022-04-22 NOTE — ED Triage Notes (Signed)
Pt via POV from home. Pt was visiting with another patient in the ER, states she wants her BP checked to make sure it isn't high. Denies dizziness. Pt does have hx of HTN. Pt is A&Ox4 and NAD

## 2022-04-25 DIAGNOSIS — M5416 Radiculopathy, lumbar region: Secondary | ICD-10-CM | POA: Diagnosis not present

## 2022-04-25 DIAGNOSIS — M5136 Other intervertebral disc degeneration, lumbar region: Secondary | ICD-10-CM | POA: Diagnosis not present

## 2022-05-09 ENCOUNTER — Emergency Department: Payer: Medicare HMO

## 2022-05-09 ENCOUNTER — Encounter: Payer: Self-pay | Admitting: Emergency Medicine

## 2022-05-09 ENCOUNTER — Other Ambulatory Visit: Payer: Self-pay

## 2022-05-09 ENCOUNTER — Emergency Department
Admission: EM | Admit: 2022-05-09 | Discharge: 2022-05-09 | Disposition: A | Discharge status: Home / Self Care | Payer: Medicare HMO | Attending: Student in an Organized Health Care Education/Training Program | Admitting: Student in an Organized Health Care Education/Training Program

## 2022-05-09 DIAGNOSIS — R519 Headache, unspecified: Secondary | ICD-10-CM | POA: Insufficient documentation

## 2022-05-09 DIAGNOSIS — I10 Essential (primary) hypertension: Secondary | ICD-10-CM | POA: Insufficient documentation

## 2022-05-09 LAB — CBC WITH DIFFERENTIAL/PLATELET
Abs Immature Granulocytes: 0.02 10*3/uL (ref 0.00–0.07)
Basophils Absolute: 0 10*3/uL (ref 0.0–0.1)
Basophils Relative: 0 %
Eosinophils Absolute: 0.1 10*3/uL (ref 0.0–0.5)
Eosinophils Relative: 1 %
HCT: 39.4 % (ref 36.0–46.0)
Hemoglobin: 12.8 g/dL (ref 12.0–15.0)
Immature Granulocytes: 0 %
Lymphocytes Relative: 38 %
Lymphs Abs: 3.5 10*3/uL (ref 0.7–4.0)
MCH: 29.3 pg (ref 26.0–34.0)
MCHC: 32.5 g/dL (ref 30.0–36.0)
MCV: 90.2 fL (ref 80.0–100.0)
Monocytes Absolute: 0.8 10*3/uL (ref 0.1–1.0)
Monocytes Relative: 8 %
Neutro Abs: 4.9 10*3/uL (ref 1.7–7.7)
Neutrophils Relative %: 53 %
Platelets: 406 10*3/uL — ABNORMAL HIGH (ref 150–400)
RBC: 4.37 MIL/uL (ref 3.87–5.11)
RDW: 13.6 % (ref 11.5–15.5)
WBC: 9.4 10*3/uL (ref 4.0–10.5)
nRBC: 0 % (ref 0.0–0.2)

## 2022-05-09 LAB — BASIC METABOLIC PANEL
Anion gap: 10 (ref 5–15)
BUN: 12 mg/dL (ref 6–20)
CO2: 25 mmol/L (ref 22–32)
Calcium: 9.5 mg/dL (ref 8.9–10.3)
Chloride: 104 mmol/L (ref 98–111)
Creatinine, Ser: 0.88 mg/dL (ref 0.44–1.00)
GFR, Estimated: >60 mL/min (ref >60)
Glucose, Bld: 98 mg/dL (ref 70–99)
Potassium: 3.9 mmol/L (ref 3.5–5.1)
Sodium: 139 mmol/L (ref 135–145)

## 2022-05-09 MED ORDER — GABAPENTIN 100 MG PO CAPS
200.0000 mg | ORAL_CAPSULE | Freq: Once | ORAL | Status: AC
  Administered 2022-05-09: 200 mg via ORAL
  Filled 2022-05-09: qty 2

## 2022-05-09 NOTE — ED Provider Notes (Signed)
[facility] Provider Note    Event Date/Time   First MD Initiated Contact with Patient 05/09/22 1508     (approximate)   History   Headache   HPI  Kendra Wang is a 56 y.o. female with a history of occipital neuralgia, chronic pain syndrome, hypertension on amlodipine presents to the ER for evaluation of of frontal and occipital headache has been going on for the past 24 to 36 hours.  Feels like her blood pressure is elevated.  States she is also noted a funny smell like she has been exposed to a new cleaning solution however she has not had any exposures.  Denies any fevers.  No neck stiffness.  No numbness or tingling.  No photophobia.  She did take her morning blood pressure medication and took some equate headache medication prior to arrival.  She does typically take gabapentin but did not take it today.  Denies thunderclap headache.  This not the worst headache of her life.     Physical Exam   Triage Vital Signs: ED Triage Vitals  Enc Vitals Group     BP 05/09/22 1459 (!) 140/85     Pulse Rate 05/09/22 1459 98     Resp 05/09/22 1459 17     Temp 05/09/22 1459 (!) 97.5 F (36.4 C)     Temp Source 05/09/22 1459 Oral     SpO2 05/09/22 1459 98 %     Weight 05/09/22 1500 235 lb (106.6 kg)     Height 05/09/22 1511 '5\' 2"'$  (1.575 m)     Head Circumference --      Peak Flow --      Pain Score 05/09/22 1500 7     Pain Loc --      Pain Edu? --      Excl. in [place]? --     Most recent vital signs: Vitals:   05/09/22 1459  BP: (!) 140/85  Pulse: 98  Resp: 17  Temp: (!) 97.5 F (36.4 C)  SpO2: 98%     Constitutional: Alert  Eyes: Conjunctivae are normal.  Head: Atraumatic. Nose: No congestion/rhinnorhea. Mouth/Throat: Mucous membranes are moist.   Neck: Painless ROM.  Cardiovascular:   Good peripheral circulation. Respiratory: Normal respiratory effort.  No retractions.  Gastrointestinal: Soft and nontender.  Musculoskeletal:  no  deformity Neurologic:  CN- intact.  No facial droop, Normal FNF.  Normal heel to shin.  Sensation intact bilaterally. Normal speech and language. No gross focal neurologic deficits are appreciated. No gait instability. Skin:  Skin is warm, dry and intact. No rash noted. Psychiatric: Mood and affect are normal. Speech and behavior are normal.    ED Results / Procedures / Treatments   Labs (all labs ordered are listed, but only abnormal results are displayed) Labs Reviewed  CBC WITH DIFFERENTIAL/PLATELET - Abnormal; Notable for the following components:      Result Value   Platelets 406 (*)    All other components within normal limits  BASIC METABOLIC PANEL     EKG     RADIOLOGY Please see ED Course for my review and interpretation.  I personally reviewed all radiographic images ordered to evaluate for the above acute complaints and reviewed radiology reports and findings.  These findings were personally discussed with the patient.  Please see medical record for radiology report.    PROCEDURES:  Critical Care performed: No  Procedures   MEDICATIONS ORDERED IN ED: Medications  gabapentin (NEURONTIN) capsule 200 mg (200  mg Oral Given 05/09/22 1542)     IMPRESSION / MDM / ASSESSMENT AND PLAN / ED COURSE  I reviewed the triage vital signs and the nursing notes.                              Differential diagnosis includes, but is not limited to, tension, cluster, occipital neuralgia, SAH, mass, edema, CVA, sinusitis, complex migraine   Patient presenting to the ER for evaluation of symptoms as described above.  Based on symptoms, risk factors and considered above differential, this presenting complaint could reflect a potentially life-threatening illness therefore the patient will be placed on continuous pulse oximetry and telemetry for monitoring.  Laboratory evaluation will be sent to evaluate for the above complaints.   CT imaging will be ordered for the above  differential.  She is clinically well-appearing in no acute distress.  No focal deficits but given her past medical history I do feel that imaging and blood work is indicated.   Clinical Course as of 05/09/22 1631  Tue May 09, 2022  1549 CT head on my review interpretation does show evidence of meningioma.  Per radiology this appears stable.  No sign of SAH IPH. [PR]  1628 Patient reassessed.  Blood work is reassuring.  Her CT imaging is without acute abnormality.  Her symptoms are improved after taking her Neurontin.  This does not seem consistent with GCA temporal arteritis.  Not consistent meningitis.  Not consistent with subarachnoid stroke.  Does appear stable appropriate for outpatient follow-up. [PR]    Clinical Course User Index [PR] Merlyn Lot, MD   FINAL CLINICAL IMPRESSION(S) / ED DIAGNOSES   Final diagnoses:  Acute nonintractable headache, unspecified headache type     Rx / DC Orders   ED Discharge Orders     None        Note:  This document was prepared using Dragon voice recognition software and may include unintentional dictation errors.    Merlyn Lot, MD 05/09/22 [phone]

## 2022-05-09 NOTE — Discharge Instructions (Signed)

## 2022-05-09 NOTE — ED Triage Notes (Signed)
Pt sts that she believes that her BP is elevated as she has been having a headache.

## 2022-05-12 ENCOUNTER — Emergency Department
Admission: EM | Admit: 2022-05-12 | Discharge: 2022-05-12 | Disposition: A | Discharge status: Home / Self Care | Payer: Medicare HMO | Attending: Emergency Medicine | Admitting: Emergency Medicine

## 2022-05-12 ENCOUNTER — Other Ambulatory Visit: Payer: Self-pay

## 2022-05-12 DIAGNOSIS — G44209 Tension-type headache, unspecified, not intractable: Secondary | ICD-10-CM | POA: Insufficient documentation

## 2022-05-12 DIAGNOSIS — I10 Essential (primary) hypertension: Secondary | ICD-10-CM | POA: Diagnosis not present

## 2022-05-12 MED ORDER — KETOROLAC TROMETHAMINE 60 MG/2ML IM SOLN
30.0000 mg | Freq: Once | INTRAMUSCULAR | Status: AC
  Administered 2022-05-12: 30 mg via INTRAMUSCULAR
  Filled 2022-05-12: qty 2

## 2022-05-12 MED ORDER — NAPROXEN 500 MG PO TABS: 500.0000 mg | ORAL_TABLET | Freq: Two times a day (BID) | ORAL | 0 refills | Status: DC | PRN

## 2022-05-12 MED ORDER — CLONIDINE HCL 0.1 MG PO TABS: 0.1000 mg | ORAL_TABLET | Freq: Two times a day (BID) | ORAL | 0 refills | Status: AC | PRN

## 2022-05-12 MED ORDER — CLONIDINE HCL 0.1 MG PO TABS
0.1000 mg | ORAL_TABLET | Freq: Once | ORAL | Status: AC
  Administered 2022-05-12: 0.1 mg via ORAL
  Filled 2022-05-12: qty 1

## 2022-05-12 MED ORDER — GABAPENTIN 300 MG PO CAPS
300.0000 mg | ORAL_CAPSULE | Freq: Once | ORAL | Status: AC
  Administered 2022-05-12: 300 mg via ORAL
  Filled 2022-05-12: qty 1

## 2022-05-12 NOTE — ED Notes (Signed)
Pt given peanut butter, crackers, and ice water

## 2022-05-12 NOTE — ED Provider Triage Note (Signed)
Emergency Medicine Provider Triage Evaluation Note  Kendra Wang , a 56 y.o. female  was evaluated in triage.  Pt complains of headache and suspects it is related to her blood pressure being elevated. She has taken her medications as prescribed without relief.   Physical Exam  BP (!) 156/95 (BP Location: Left Arm)   Pulse 82   Temp 98 F (36.7 C) (Oral)   Resp 16   Ht '5\' 2"'$  (1.575 m)   Wt 106.5 kg   LMP  (LMP Unknown)   SpO2 98%   BMI 42.94 kg/m  Gen:   Awake, no distress   Resp:  Normal effort  MSK:   Moves extremities without difficulty  Other:    Medical Decision Making  Medically screening exam initiated at 5:20 PM.  Appropriate orders placed.  Kendra Wang was informed that the remainder of the evaluation will be completed by another provider, this initial triage assessment does not replace that evaluation, and the importance of remaining in the ED until their evaluation is complete.    Kendra Dike, FNP 05/12/22 1722

## 2022-05-12 NOTE — Discharge Instructions (Addendum)
Call and see if you can schedule an appointment at the clinic for blood pressure and headache management.  Purchase a blood pressure cuff at the pharmacy and keep a log of the readings to show your primary care provider.  If your blood pressure is over 140/90 and you are having a headache, please take a clonidine as prescribed.  Return to the emergency department for symptoms that change or worsen if you are unable to see primary care.

## 2022-05-12 NOTE — ED Provider Notes (Signed)
[facility] Provider Note    Event Date/Time   First MD Initiated Contact with Patient 05/12/22 1842     (approximate)   History   Headache   HPI  Kendra Wang is a 56 y.o. female with history of hypertension presents to the emergency department for treatment and evaluation of headache.  She states that she has had a headache for a week despite taking her blood pressure medications.  She decided to take 1-1/2 today to see if it would help with her headache but it did not.  She also has a history of occipital neuralgia but this is different.  She has had no chest pain or shortness of breath.  She has tried to get in with primary care but the next available appointment is February.  She did call today and leave a message to request to be evaluated sooner.      Physical Exam   Triage Vital Signs: ED Triage Vitals  Enc Vitals Group     BP 05/12/22 1716 (!) 156/95     Pulse Rate 05/12/22 1716 82     Resp 05/12/22 1716 16     Temp 05/12/22 1716 98 F (36.7 C)     Temp Source 05/12/22 1716 Oral     SpO2 05/12/22 1716 98 %     Weight 05/12/22 1718 234 lb 12.6 oz (106.5 kg)     Height 05/12/22 1718 '5\' 2"'$  (1.575 m)     Head Circumference --      Peak Flow --      Pain Score 05/12/22 1717 5     Pain Loc --      Pain Edu? --      Excl. in [place]? --     Most recent vital signs: Vitals:   05/12/22 1716 05/12/22 2029  BP: (!) 156/95 (!) 141/91  Pulse: 82 76  Resp: 16 18  Temp: 98 F (36.7 C)   SpO2: 98% 99%     General: Awake, no distress.  CV:  Good peripheral perfusion.  Resp:  Normal effort.  Abd:  No distention.  Other:  No neurodeficits   ED Results / Procedures / Treatments   Labs (all labs ordered are listed, but only abnormal results are displayed) Labs Reviewed - No data to display   EKG  Not indicated   RADIOLOGY  Not indicated   PROCEDURES:  Critical Care performed: No  Procedures   MEDICATIONS ORDERED IN  ED: Medications  cloNIDine (CATAPRES) tablet 0.1 mg (0.1 mg Oral Given 05/12/22 1950)  ketorolac (TORADOL) injection 30 mg (30 mg Intramuscular Given 05/12/22 1950)  gabapentin (NEURONTIN) capsule 300 mg (300 mg Oral Given 05/12/22 1951)     IMPRESSION / MDM / ASSESSMENT AND PLAN / ED COURSE  I reviewed the triage vital signs and the nursing notes.                              Differential diagnosis includes, but is not limited to, hypertensive urgency, hypertensive crisis, hypertension, tension headache, migraine, occipital neuralgia  Patient's presentation is most consistent with acute illness / injury with system symptoms.  56 year old female presenting to the emergency department for treatment and evaluation of persistent headache over the week.  See HPI for further details.  She is somewhat hypertensive here 156/95 while in triage.  She was here 3 days ago with similar complaint and labs were drawn at that  time.  Renal function was normal and other labs are reassuring as well.  No indication to repeat the labs today as the symptoms have not changed.  Plan will be to give her an injection of Toradol as she has not taken any ibuprofen, Tylenol, or other over-the-counter medications.  Will also give her a 0.1 mg dose of clonidine to help with her blood pressure.  She is also requesting to take her nightly dose of gabapentin since she is here and not able to take it on time.  Blood pressure has slightly decreased and headache is better after medications.  Patient feels comfortable being discharged home.  She was encouraged to follow-up with primary care/the clinic for headache management and blood pressure control.  ER return precautions were discussed as well.     FINAL CLINICAL IMPRESSION(S) / ED DIAGNOSES   Final diagnoses:  Hypertension, unspecified type  Acute non intractable tension-type headache     Rx / DC Orders   ED Discharge Orders          Ordered    cloNIDine  (CATAPRES) 0.1 MG tablet  Every 12 hours PRN        05/12/22 2052    naproxen (NAPROSYN) 500 MG tablet  2 times daily PRN        05/12/22 2052             Note:  This document was prepared using Dragon voice recognition software and may include unintentional dictation errors.   Victorino Dike, FNP 05/12/22 2056    Blake Divine, MD 05/12/22 [phone]

## 2022-05-12 NOTE — ED Triage Notes (Signed)
Pt to ED for same symptoms she was seen for on 11/21. Pt advised she has taken extra BP meds at home. She is in between PCP and called one to see if they would change her BP meds. She is waiting on them to call her back. Pt is CAOX4 and in no acute distress. NP at bedside.

## 2022-08-14 DIAGNOSIS — R7303 Prediabetes: Secondary | ICD-10-CM | POA: Diagnosis not present

## 2022-08-14 DIAGNOSIS — Z0000 Encounter for general adult medical examination without abnormal findings: Secondary | ICD-10-CM | POA: Diagnosis not present

## 2022-08-14 DIAGNOSIS — R8290 Unspecified abnormal findings in urine: Secondary | ICD-10-CM | POA: Diagnosis not present

## 2022-08-14 DIAGNOSIS — I10 Essential (primary) hypertension: Secondary | ICD-10-CM | POA: Diagnosis not present

## 2022-08-14 DIAGNOSIS — Z1211 Encounter for screening for malignant neoplasm of colon: Secondary | ICD-10-CM | POA: Diagnosis not present

## 2022-08-14 DIAGNOSIS — Z6841 Body Mass Index (BMI) 40.0 and over, adult: Secondary | ICD-10-CM | POA: Diagnosis not present

## 2022-08-14 DIAGNOSIS — B372 Candidiasis of skin and nail: Secondary | ICD-10-CM | POA: Diagnosis not present

## 2022-08-14 DIAGNOSIS — M47816 Spondylosis without myelopathy or radiculopathy, lumbar region: Secondary | ICD-10-CM | POA: Diagnosis not present

## 2022-08-15 ENCOUNTER — Other Ambulatory Visit: Payer: Self-pay | Admitting: Internal Medicine

## 2022-08-15 DIAGNOSIS — M5416 Radiculopathy, lumbar region: Secondary | ICD-10-CM

## 2022-08-15 DIAGNOSIS — M47816 Spondylosis without myelopathy or radiculopathy, lumbar region: Secondary | ICD-10-CM

## 2022-08-20 ENCOUNTER — Ambulatory Visit
Admission: RE | Admit: 2022-08-20 | Discharge: 2022-08-20 | Disposition: A | Discharge status: Home / Self Care | Payer: Medicare HMO | Source: Ambulatory Visit | Attending: Internal Medicine | Admitting: Internal Medicine

## 2022-08-20 DIAGNOSIS — M5416 Radiculopathy, lumbar region: Secondary | ICD-10-CM | POA: Insufficient documentation

## 2022-08-20 DIAGNOSIS — M545 Low back pain, unspecified: Secondary | ICD-10-CM | POA: Diagnosis not present

## 2022-08-20 DIAGNOSIS — M47816 Spondylosis without myelopathy or radiculopathy, lumbar region: Secondary | ICD-10-CM | POA: Diagnosis not present

## 2022-08-20 DIAGNOSIS — M4316 Spondylolisthesis, lumbar region: Secondary | ICD-10-CM | POA: Diagnosis not present

## 2022-08-23 DIAGNOSIS — H15101 Unspecified episcleritis, right eye: Secondary | ICD-10-CM | POA: Diagnosis not present

## 2022-08-30 DIAGNOSIS — H15101 Unspecified episcleritis, right eye: Secondary | ICD-10-CM | POA: Diagnosis not present

## 2022-09-01 DIAGNOSIS — R8290 Unspecified abnormal findings in urine: Secondary | ICD-10-CM | POA: Diagnosis not present

## 2022-09-07 DIAGNOSIS — Z1211 Encounter for screening for malignant neoplasm of colon: Secondary | ICD-10-CM | POA: Diagnosis not present

## 2022-09-13 LAB — COLOGUARD: COLOGUARD: NEGATIVE

## 2022-09-13 LAB — EXTERNAL GENERIC LAB PROCEDURE: COLOGUARD: NEGATIVE

## 2022-10-12 ENCOUNTER — Other Ambulatory Visit: Payer: Self-pay | Admitting: Internal Medicine

## 2022-10-12 DIAGNOSIS — Z1231 Encounter for screening mammogram for malignant neoplasm of breast: Secondary | ICD-10-CM

## 2022-10-26 DIAGNOSIS — R0981 Nasal congestion: Secondary | ICD-10-CM | POA: Diagnosis not present

## 2022-10-26 DIAGNOSIS — Z03818 Encounter for observation for suspected exposure to other biological agents ruled out: Secondary | ICD-10-CM | POA: Diagnosis not present

## 2022-10-26 DIAGNOSIS — R6889 Other general symptoms and signs: Secondary | ICD-10-CM | POA: Diagnosis not present

## 2022-10-26 DIAGNOSIS — R051 Acute cough: Secondary | ICD-10-CM | POA: Diagnosis not present

## 2022-10-27 DIAGNOSIS — J329 Chronic sinusitis, unspecified: Secondary | ICD-10-CM | POA: Diagnosis not present

## 2022-11-08 ENCOUNTER — Ambulatory Visit
Admission: RE | Admit: 2022-11-08 | Discharge: 2022-11-08 | Disposition: A | Discharge status: Home / Self Care | Payer: Medicare HMO | Source: Ambulatory Visit | Attending: Internal Medicine | Admitting: Internal Medicine

## 2022-11-08 DIAGNOSIS — Z1231 Encounter for screening mammogram for malignant neoplasm of breast: Secondary | ICD-10-CM | POA: Diagnosis not present

## 2022-11-17 DIAGNOSIS — M1711 Unilateral primary osteoarthritis, right knee: Secondary | ICD-10-CM | POA: Diagnosis not present

## 2022-11-24 DIAGNOSIS — E782 Mixed hyperlipidemia: Secondary | ICD-10-CM | POA: Diagnosis not present

## 2022-11-24 DIAGNOSIS — M170 Bilateral primary osteoarthritis of knee: Secondary | ICD-10-CM | POA: Diagnosis not present

## 2022-11-24 DIAGNOSIS — I10 Essential (primary) hypertension: Secondary | ICD-10-CM | POA: Diagnosis not present

## 2022-12-27 DIAGNOSIS — H524 Presbyopia: Secondary | ICD-10-CM | POA: Diagnosis not present

## 2022-12-27 DIAGNOSIS — H35033 Hypertensive retinopathy, bilateral: Secondary | ICD-10-CM | POA: Diagnosis not present

## 2023-01-09 ENCOUNTER — Encounter: Payer: Self-pay | Admitting: Internal Medicine

## 2023-01-09 ENCOUNTER — Ambulatory Visit (INDEPENDENT_AMBULATORY_CARE_PROVIDER_SITE_OTHER): Payer: Medicare HMO | Admitting: Internal Medicine

## 2023-01-09 VITALS — BP 132/82 | HR 105 | Temp 98.0°F | Resp 16 | Ht 61.5 in | Wt 246.0 lb

## 2023-01-09 DIAGNOSIS — E559 Vitamin D deficiency, unspecified: Secondary | ICD-10-CM

## 2023-01-09 DIAGNOSIS — I10 Essential (primary) hypertension: Secondary | ICD-10-CM

## 2023-01-09 DIAGNOSIS — E7800 Pure hypercholesterolemia, unspecified: Secondary | ICD-10-CM

## 2023-01-09 DIAGNOSIS — R5383 Other fatigue: Secondary | ICD-10-CM | POA: Diagnosis not present

## 2023-01-09 DIAGNOSIS — K219 Gastro-esophageal reflux disease without esophagitis: Secondary | ICD-10-CM

## 2023-01-09 DIAGNOSIS — R7303 Prediabetes: Secondary | ICD-10-CM | POA: Diagnosis not present

## 2023-01-09 DIAGNOSIS — Z9109 Other allergy status, other than to drugs and biological substances: Secondary | ICD-10-CM | POA: Diagnosis not present

## 2023-01-09 LAB — CBC WITH DIFFERENTIAL/PLATELET
Basophils Relative: 0.7 %
Eosinophils Relative: 1.6 %
Lymphs Abs: 3794 cells/uL (ref 850–3900)
MCV: 88.8 fL (ref 80.0–100.0)
MPV: 9.8 fL (ref 7.5–12.5)
Neutro Abs: 2380 cells/uL (ref 1500–7800)
Platelets: 355 10*3/uL (ref 140–400)
RBC: 4.57 10*6/uL (ref 3.80–5.10)
RDW: 13.1 % (ref 11.0–15.0)
WBC: 7 10*3/uL (ref 3.8–10.8)
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
## 2023-01-10 LAB — BASIC METABOLIC PANEL
BUN: 12 mg/dL (ref 7–25)
CO2: 26 mmol/L (ref 20–32)
Calcium: 10 mg/dL (ref 8.6–10.4)
Chloride: 103 mmol/L (ref 98–110)
Creat: 0.91 mg/dL (ref 0.50–1.03)
Glucose, Bld: 83 mg/dL (ref 65–99)
Potassium: 4.1 mmol/L (ref 3.5–5.3)
Sodium: 140 mmol/L (ref 135–146)

## 2023-01-10 LAB — TSH: TSH: 1.58 mIU/L (ref 0.40–4.50)

## 2023-01-10 LAB — CBC WITH DIFFERENTIAL/PLATELET
Absolute Monocytes: 665 cells/uL (ref 200–950)
Basophils Absolute: 49 cells/uL (ref 0–200)
Eosinophils Absolute: 112 cells/uL (ref 15–500)
HCT: 40.6 % (ref 35.0–45.0)
Hemoglobin: 13.4 g/dL (ref 11.7–15.5)
MCH: 29.3 pg (ref 27.0–33.0)
MCHC: 33 g/dL (ref 32.0–36.0)
Monocytes Relative: 9.5 %
Neutrophils Relative %: 34 %
Total Lymphocyte: 54.2 %

## 2023-01-10 LAB — HEMOGLOBIN A1C
Hgb A1c MFr Bld: 5.9 % of total Hgb — ABNORMAL HIGH (ref <5.7)
Mean Plasma Glucose: 123 mg/dL
eAG (mmol/L): 6.8 mmol/L

## 2023-01-10 LAB — VITAMIN D 25 HYDROXY (VIT D DEFICIENCY, FRACTURES): Vit D, 25-Hydroxy: 41 ng/mL (ref 30–100)

## 2023-01-14 IMAGING — US US EXTREM LOW VENOUS*L*
1 series · 14 of 24 positions shown · non-contrast
Comparison: RIGHT lower extremity venous sonogram.

CLINICAL DATA: Leg swelling in the LEFT lower extremity and pain
for months.

EXAM:
LEFT LOWER EXTREMITY VENOUS DOPPLER ULTRASOUND
TECHNIQUE: Gray-scale sonography with compression, as well as color and duplex
ultrasound, were performed to evaluate the deep venous system(s)
from the level of the common femoral vein through the popliteal and
proximal calf veins.

[Series 1: us venous img lower uni left (dvt) · portal-venous · 14 of 33 slices shown]
[im 1/33]
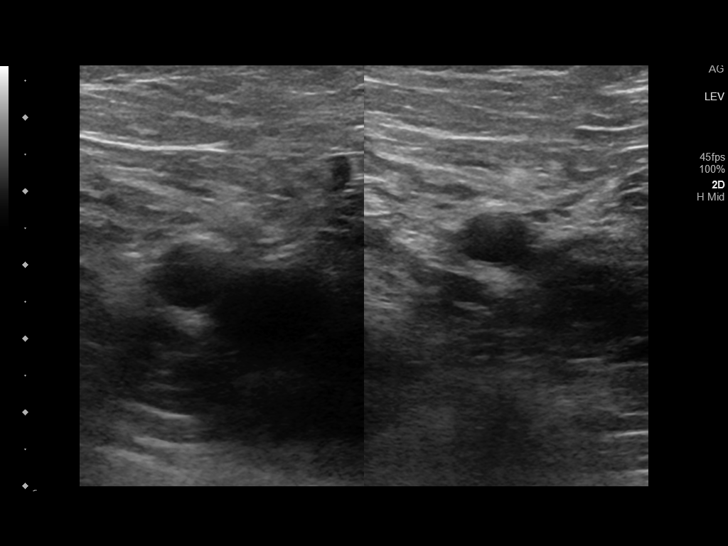
[im 3/33]
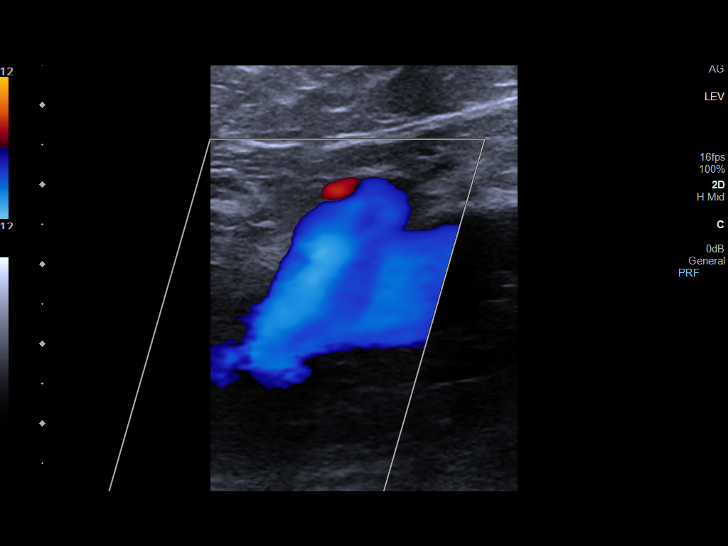
[im 6/33]
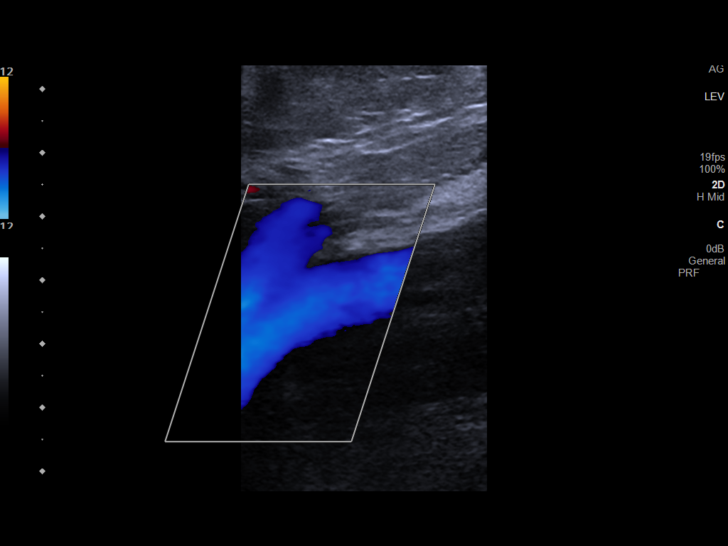
[im 9/33]
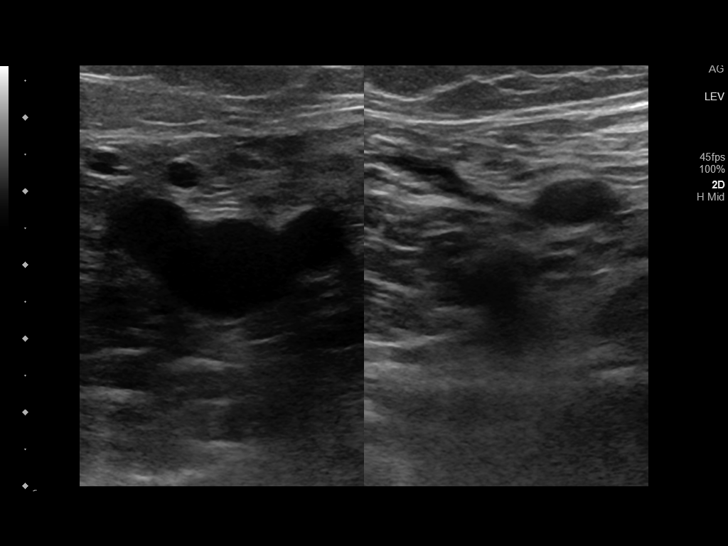
[im 10/33]
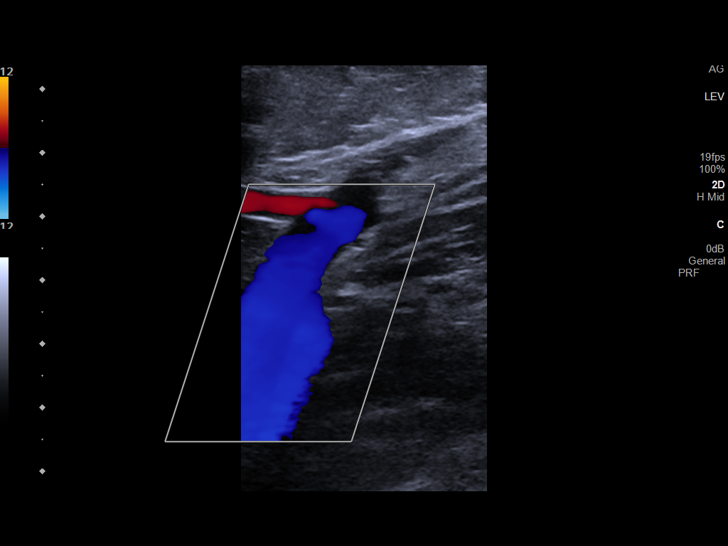
[im 13/33]
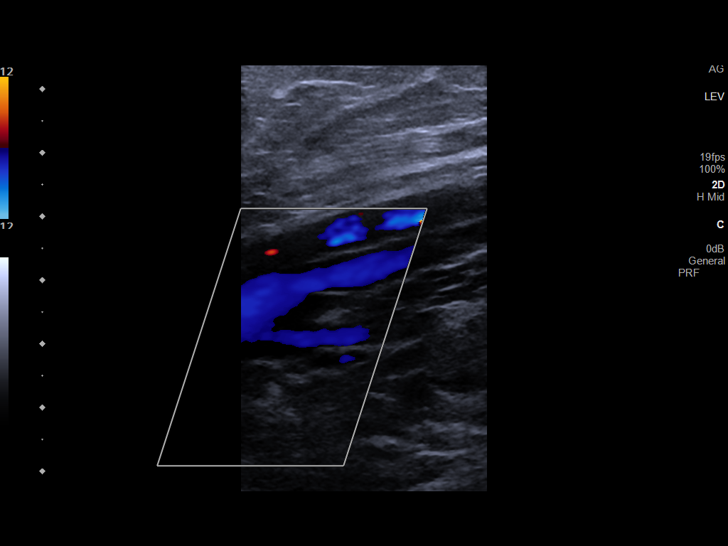
[im 16/33]
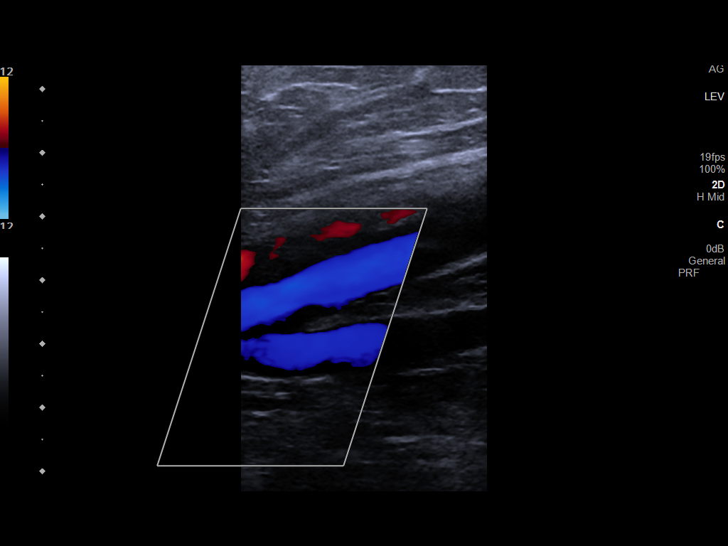
[im 17/33]
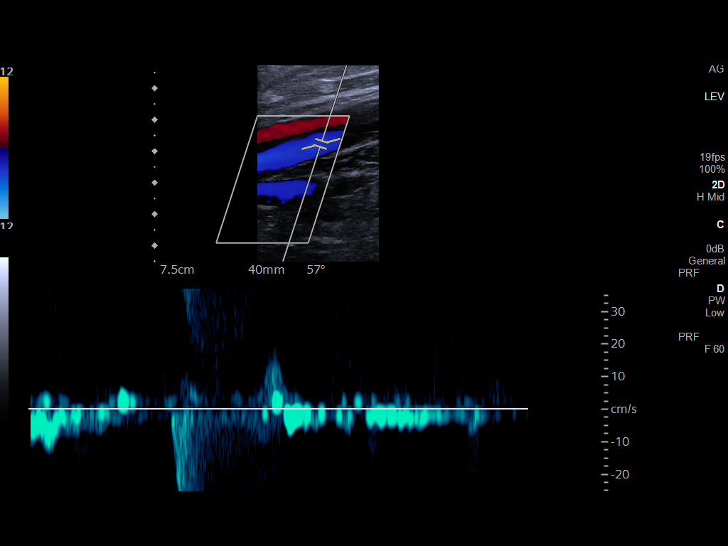
[im 20/33]
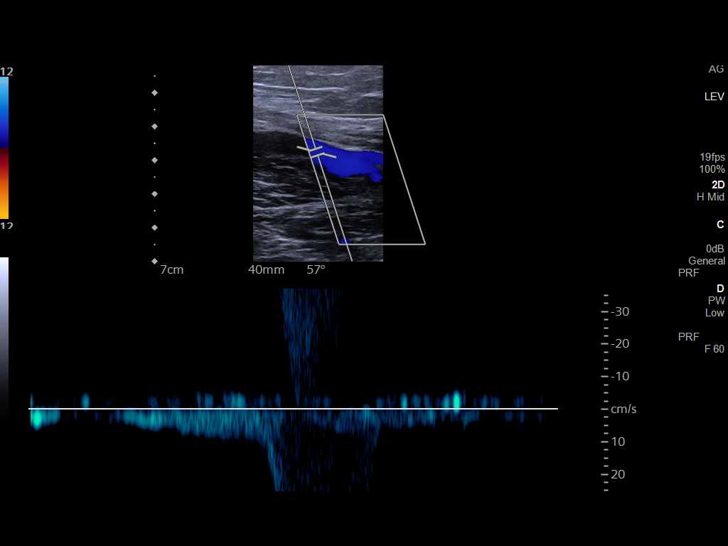
[im 23/33]
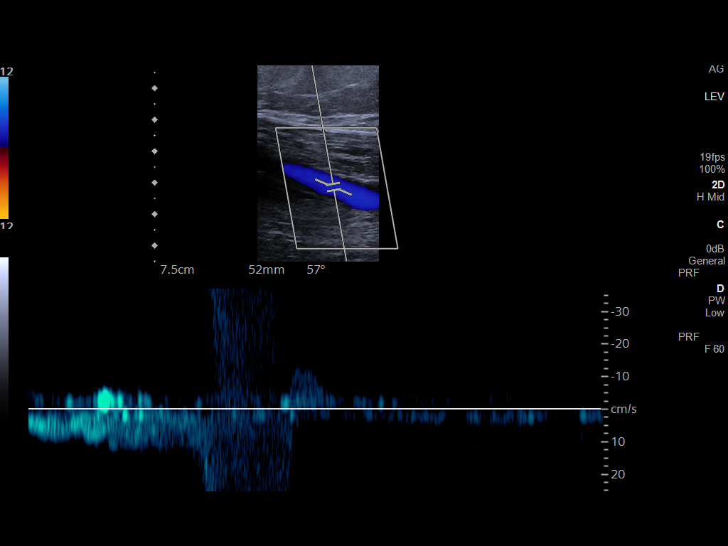
[im 26/33]
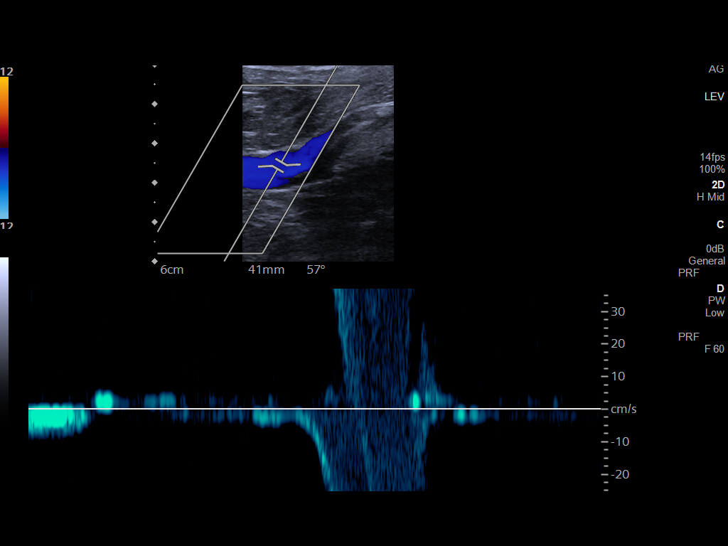
[im 27/33]
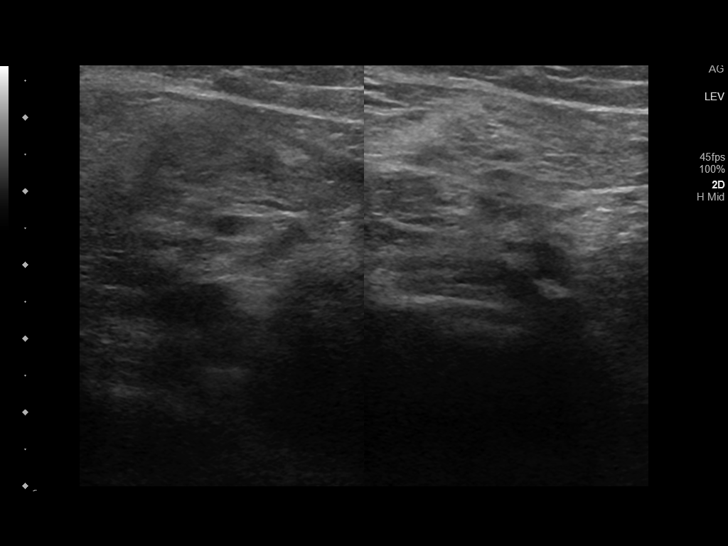
[im 30/33]
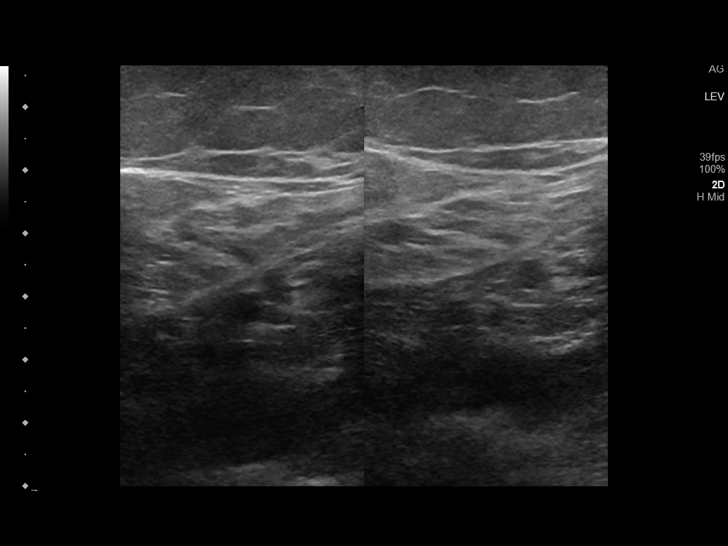
[im 33/33]
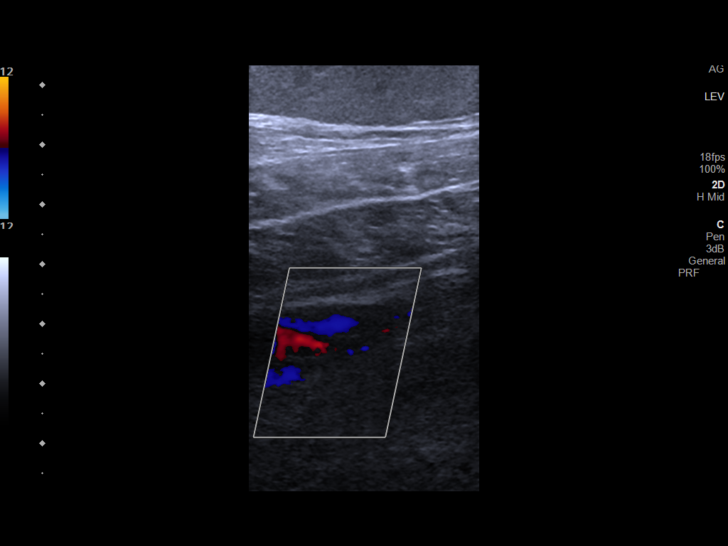

[14 of 24 positions shown; findings below may reference images not displayed]

FINDINGS: VENOUS

Normal compressibility of the common femoral, superficial femoral,
and popliteal veins, as well as the visualized calf veins.
Visualized portions of profunda femoral vein and great saphenous
vein unremarkable. No filling defects to suggest DVT on grayscale or
color Doppler imaging. Doppler waveforms show normal direction of
venous flow, normal respiratory plasticity and response to
augmentation.

Limited views of the contralateral common femoral vein are
unremarkable.

OTHER

None.

Limitations: none
IMPRESSION: Negative

## 2023-02-21 DIAGNOSIS — M1711 Unilateral primary osteoarthritis, right knee: Secondary | ICD-10-CM | POA: Diagnosis not present

## 2023-03-01 ENCOUNTER — Ambulatory Visit (INDEPENDENT_AMBULATORY_CARE_PROVIDER_SITE_OTHER): Payer: Medicare HMO

## 2023-03-01 VITALS — Ht 62.0 in | Wt 246.0 lb

## 2023-03-01 DIAGNOSIS — Z0000 Encounter for general adult medical examination without abnormal findings: Secondary | ICD-10-CM | POA: Diagnosis not present

## 2023-03-01 NOTE — Patient Instructions (Addendum)
Kendra Wang , Thank you for taking time to come for your Medicare Wellness Visit. I appreciate your ongoing commitment to your health goals. Please review the following plan we discussed and let me know if I can assist you in the future.   Referrals/Orders/Follow-Ups/Clinician Recommendations: none  This is a list of the screening recommended for you and due dates:  Health Maintenance  Topic Date Due   DTaP/Tdap/Td vaccine (2 - Tdap) 01/28/2018   COVID-19 Vaccine (1 - 2023-24 season) Never done   Pap Smear  03/12/2023   Medicare Annual Wellness Visit  02/29/2024   Mammogram  11/07/2024   Hepatitis C Screening  Completed   HPV Vaccine  Aged Out   Flu Shot  Discontinued   Colon Cancer Screening  Discontinued   HIV Screening  Discontinued   Zoster (Shingles) Vaccine  Discontinued    Advanced directives: (Provided) Advance directive discussed with you today. I have provided a copy for you to complete at home and have notarized. Once this is complete, please bring a copy in to our office so we can scan it into your chart.  Mailed to pt  Next Medicare Annual Wellness Visit scheduled for next year: Yes 03/06/24 @ 2:10pm telephone
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
## 2023-05-10 ENCOUNTER — Other Ambulatory Visit: Payer: Self-pay

## 2023-05-10 ENCOUNTER — Emergency Department
Admission: EM | Admit: 2023-05-10 | Discharge: 2023-05-10 | Disposition: A | Discharge status: Home / Self Care | Payer: Medicare HMO | Attending: Emergency Medicine | Admitting: Emergency Medicine

## 2023-05-10 ENCOUNTER — Encounter: Payer: Self-pay | Admitting: Emergency Medicine

## 2023-05-10 ENCOUNTER — Emergency Department: Payer: Medicare HMO

## 2023-05-10 DIAGNOSIS — M7989 Other specified soft tissue disorders: Secondary | ICD-10-CM | POA: Diagnosis not present

## 2023-05-10 DIAGNOSIS — M79605 Pain in left leg: Secondary | ICD-10-CM | POA: Diagnosis not present

## 2023-05-10 DIAGNOSIS — I10 Essential (primary) hypertension: Secondary | ICD-10-CM | POA: Insufficient documentation

## 2023-05-10 MED ORDER — NAPROXEN 500 MG PO TABS: 500.0000 mg | ORAL_TABLET | Freq: Two times a day (BID) | ORAL | 0 refills | Status: DC | PRN

## 2023-05-10 NOTE — ED Triage Notes (Signed)
Patient to ED via POV for left leg swelling/ pain. States ongoing for a few weeks. Denies hx of blood clots. Hx of HTN- took meds approx 1 hr ago

## 2023-05-10 NOTE — ED Provider Notes (Signed)
[facility] Provider Note    Event Date/Time   First MD Initiated Contact with Patient 05/10/23 1226     (approximate)   History   Leg Swelling   HPI  Kendra Wang is a 57 y.o. female presents to the ED with complaint of left leg swelling for several weeks.  Patient denies any recent injury to her leg.  No previous history of DVT.  Patient does have a history of hypertension and took her blood pressure medication approximately 1 hour ago.  She also has history of prediabetes, occipital neuralgia, chronic pain syndrome, GERD, vitamin D deficiency.     Physical Exam   Triage Vital Signs: ED Triage Vitals [05/10/23 1213]  Encounter Vitals Group     BP (!) 162/116     Systolic BP Percentile      Diastolic BP Percentile      Pulse Rate (!) 115     Resp 18     Temp 98.6 F (37 C)     Temp Source Oral     SpO2 100 %     Weight 240 lb (108.9 kg)     Height 5\' 3"  (1.6 m)     Head Circumference      Peak Flow      Pain Score 0     Pain Loc      Pain Education      Exclude from Growth Chart     Most recent vital signs: Vitals:   05/10/23 1213  BP: (!) 162/116  Pulse: (!) 115  Resp: 18  Temp: 98.6 F (37 C)  SpO2: 100%     General: Awake, no distress.  CV:  Good peripheral perfusion.  Resp:  Normal effort.  Abd:  No distention.  Other:  Examination of the left lower extremity there is no pitting edema present.  No skin discoloration or erythema.  Motor or sensory function intact.  Patient is able to stand and ambulate without any assistance.  Pulses are present.   ED Results / Procedures / Treatments   Labs (all labs ordered are listed, but only abnormal results are displayed) Labs Reviewed - No data to display   RADIOLOGY Ultrasound venous of the left lower extremity is negative for DVT per radiologist.    PROCEDURES:  Critical Care performed:   Procedures   MEDICATIONS ORDERED IN ED: Medications - No data to  display   IMPRESSION / MDM / ASSESSMENT AND PLAN / ED COURSE  I reviewed the triage vital signs and the nursing notes.   Differential diagnosis includes, but is not limited to, DVT, muscle strain, cellulitis, acute left lower extremity pain.  57 year old female presents to the ED with complaint of left lower extremity pain for several weeks without known history of injury.  Physical exam is benign.  Ultrasound per radiologist is negative for DVT and patient was reassured.  A prescription for naproxen 500 mg twice daily for 10 days was sent to the pharmacy as patient has taken that in the past.  Blood pressure was elevated in the emergency department however patient took her blood pressure medication 1 hour prior to arrival.  She is encouraged to follow-up with her PCP for recheck of her leg and also her blood pressure.      Patient's presentation is most consistent with acute presentation with potential threat to life or bodily function.  FINAL CLINICAL IMPRESSION(S) / ED DIAGNOSES   Final diagnoses:  Acute leg pain, left  Rx / DC Orders   ED Discharge Orders          Ordered    naproxen (NAPROSYN) 500 MG tablet  2 times daily PRN        05/10/23 1414             Note:  This document was prepared using Dragon voice recognition software and may include unintentional dictation errors.   Tommi Rumps, PA-C 05/10/23 1425    Jene Every, MD 05/10/23 1425

## 2023-05-10 NOTE — Discharge Instructions (Signed)
Call make an appoint with your primary care provider if any continued problems or concerns.  A prescription for naproxen which you have been on in the past was sent to the pharmacy for you to take for the next 10 days.  Make sure you take this with food to avoid any stomach issues.

## 2023-05-24 ENCOUNTER — Ambulatory Visit: Payer: Self-pay

## 2023-05-24 NOTE — Telephone Encounter (Signed)
FYI

## 2023-05-24 NOTE — Telephone Encounter (Signed)
  Chief Complaint: left medical thigh swelling Symptoms:feels like a burning sensation and feels tight "like a rope is tied around it, hurts when pt is supine or when she turns over in bed. Doesn't hurt with walking or standing Frequency: 1 month Pertinent Negatives: Patient denies warmth, pain with walking, Chest pain SOB Disposition: [x] ED /[] Urgent Care (no appt availability in office) / [] Appointment(In office/virtual)/ []  [place] [facility]/ [] Home Care/ [] Refused Recommended Disposition /[] [facility]/ []  Follow-up with PCP Additional Notes: called office and spoke with Cassandra and was advised to follow disposition pt to go to ED for reevaluation to r/o DVT  Pt also having left breast pain comes and goes  Onset for the last 2 years.  Advised pt to call back to schedule appt to discuss breast pain and results of [place] and thigh swelling. Pt verbalized understanding. Reason for Disposition  [1] Thigh, calf, or ankle swelling AND [2] only 1 side  [1] Breast pain AND [2] cause is not known  Answer Assessment - Initial Assessment Questions 1. ONSET: "When did the swelling start?" (e.g., minutes, hours, days)     1 month ago  2. LOCATION: "What part of the leg is swollen?"  "Are both legs swollen or just one leg?"     Upper left thigh 3. SEVERITY: "How bad is the swelling?" (e.g., localized; mild, moderate, severe)   - Localized: Small area of swelling localized to one leg.   - MILD pedal edema: Swelling limited to foot and ankle, pitting edema < 1/4 inch (6 mm) deep, rest and elevation eliminate most or all swelling.   - MODERATE edema: Swelling of lower leg to knee, pitting edema > 1/4 inch (6 mm) deep, rest and elevation only partially reduce swelling.   - SEVERE edema: Swelling extends above knee, facial or hand swelling present.      Mid medial thigh left leg 4. REDNESS: "Does the swelling look red or infected?"     no 5. PAIN: "Is the swelling painful to touch?" If  Yes, ask: "How painful is it?"   (Scale 1-10; mild, moderate or severe)     Feels like a rope is tied on leg burning sensation not painful to touch- worse when supine not with walking or standing 6. FEVER: "Do you have a fever?" If Yes, ask: "What is it, how was it measured, and when did it start?"      no 7. CAUSE: "What do you think is causing the leg swelling?"     unsure 8. MEDICAL HISTORY: "Do you have a history of blood clots (e.g., DVT), cancer, heart failure, kidney disease, or liver failure?"     no 9. RECURRENT SYMPTOM: "Have you had leg swelling before?" If Yes, ask: "When was the last time?" "What happened that time?"     no 10. OTHER SYMPTOMS: "Do you have any other symptoms?" (e.g., chest pain, difficulty breathing)       no 11. PREGNANCY: "Is there any chance you are pregnant?" "When was your last menstrual period?"       N/a  Answer Assessment - Initial Assessment Questions 1. SYMPTOM: "What's the main symptom you're concerned about?"  (e.g., lump, pain, rash, nipple discharge)     Shooting pain that comes and goes  2. LOCATION: "Where is the pain located?"     Center of breast 3. ONSET: "When did pain  start?"     2 years  Protocols used: Leg Swelling and Edema-A-AH, Breast Symptoms-A-AH

## 2023-05-26 ENCOUNTER — Emergency Department: Payer: Medicare HMO

## 2023-05-26 ENCOUNTER — Emergency Department
Admission: EM | Admit: 2023-05-26 | Discharge: 2023-05-26 | Disposition: A | Discharge status: Home / Self Care | Payer: Medicare HMO | Attending: Emergency Medicine | Admitting: Emergency Medicine

## 2023-05-26 ENCOUNTER — Encounter: Payer: Self-pay | Admitting: Radiology

## 2023-05-26 ENCOUNTER — Other Ambulatory Visit: Payer: Self-pay

## 2023-05-26 DIAGNOSIS — M79652 Pain in left thigh: Secondary | ICD-10-CM | POA: Diagnosis not present

## 2023-05-26 DIAGNOSIS — I10 Essential (primary) hypertension: Secondary | ICD-10-CM | POA: Diagnosis not present

## 2023-05-26 DIAGNOSIS — M7989 Other specified soft tissue disorders: Secondary | ICD-10-CM | POA: Insufficient documentation

## 2023-05-26 DIAGNOSIS — R229 Localized swelling, mass and lump, unspecified: Secondary | ICD-10-CM

## 2023-05-26 DIAGNOSIS — R2242 Localized swelling, mass and lump, left lower limb: Secondary | ICD-10-CM | POA: Diagnosis not present

## 2023-05-26 NOTE — Discharge Instructions (Addendum)
Your exam and ultrasound are reassuring at this time.  Your area of concern to the inner thigh may represent a lipoma-like soft tissue mass.  There is no evidence of a blood clot to the lower leg based on the ultrasound over the area of concern today.  Your previous ultrasound also did not indicate evidence of any blood clots of the lower leg.  You may follow-up with general surgery if you have further concern for this noncancerous, not precancerous soft tissue swelling.  Follow with primary provider for ongoing concerns.

## 2023-05-26 NOTE — ED Triage Notes (Signed)
Pt states she is having continued swelling and discomfort in the left leg. She had a ultrasound here on 11-21 and at the time did not have a DVT.  Pt provider instructed her to come back to the ed for another ultrasound to rule out DVT again.

## 2023-05-28 NOTE — ED Provider Notes (Signed)
[facility] Emergency Department Provider Note     Event Date/Time   First MD Initiated Contact with Patient 05/26/23 2133     (approximate)   History   Leg Swelling   HPI  Kendra Wang is a 57 y.o. female with a history of GERD, HTN, anxiety, HLD, and obesity, presents to the ED for continued swelling to the proximal left thigh.  Patient was seen here last month for evaluation of swelling to left leg.  She reports a negative DVT study at that time, but notes that the area of concern was actually to the inner thigh.  Today she was calling to follow with primary provider, and advised by the PCPs nurse to report to the ED for a repeat DVT study of the left lower extremity.  Patient denies any interim injury, trauma, or falls.  No chest pain no cough no shortness of breath reported in the interim.  Patient localizes pain to this area to the anterior thigh that is been present for several months by her recollection.  Physical Exam   Triage Vital Signs: ED Triage Vitals  Encounter Vitals Group     BP 05/26/23 1908 (!) 137/99     Systolic BP Percentile --      Diastolic BP Percentile --      Pulse Rate 05/26/23 1908 98     Resp 05/26/23 1908 18     Temp 05/26/23 1908 97.9 F (36.6 C)     Temp Source 05/26/23 1908 Oral     SpO2 05/26/23 1905 96 %     Weight 05/26/23 1905 240 lb (108.9 kg)     Height 05/26/23 1905 5\' 3"  (1.6 m)     Head Circumference --      Peak Flow --      Pain Score 05/26/23 2321 0     Pain Loc --      Pain Education --      Exclude from Growth Chart --     Most recent vital signs: Vitals:   05/26/23 1908 05/26/23 2320  BP: (!) 137/99 (!) 152/93  Pulse: 98 84  Resp: 18   Temp: 97.9 F (36.6 C)   SpO2: 98% 99%    General Awake, no distress. NAD CV:  Good peripheral perfusion.  2+ pitting edema to the bilateral lower extremities RESP:  Normal effort. CTA MSK:  Left lower extremity with an area of soft tissue fullness to  the medial anterior proximal thigh.  The area of soft tissue fullness appears to indicate a cystic but lobulated lesion in the subcutaneous tissues.  The area is soft, mobile, and without increased warmth, erythema, induration, or pain.   ED Results / Procedures / Treatments   Labs (all labs ordered are listed, but only abnormal results are displayed) Labs Reviewed - No data to display   EKG   RADIOLOGY  I personally viewed and evaluated these images as part of my medical decision making, as well as reviewing the written report by the radiologist.  ED Provider Interpretation: No acute findings  No results found.   PROCEDURES:  Critical Care performed: No  Procedures   MEDICATIONS ORDERED IN ED: Medications - No data to display   IMPRESSION / MDM / ASSESSMENT AND PLAN / ED COURSE  I reviewed the triage vital signs and the nursing notes.  Differential diagnosis includes, but is not limited to, lipoma, hematoma, continuous cyst, seroma  Patient's presentation is most consistent with acute complicated illness / injury requiring diagnostic workup.  Patient's diagnosis is consistent with localized soft tissue swelling to the LLE.  No evidence or concern clinically for DVT based on the focal anterior area of concern.  Subcu tissue is evaluated with ultrasound focally, and did not indicate any acute findings.  Doppler blood flow was confirmed by ultrasound, and is reviewed by me.  The patient is overall reassured by this repeat soft tissue ultrasound over the area of concern.  No acute or concerning findings on this presentation.  Symptoms likely represent a benign soft tissue lesion over the area of concern.  Patient is to follow up with her PCP or general surgery as discussed, as needed or otherwise directed. Patient is given ED precautions to return to the ED for any worsening or new symptoms.   FINAL CLINICAL IMPRESSION(S) / ED DIAGNOSES   Final  diagnoses:  Left leg swelling  Localized soft tissue swelling     Rx / DC Orders   ED Discharge Orders     None        Note:  This document was prepared using Dragon voice recognition software and may include unintentional dictation errors.    Lissa Hoard, PA-C 05/28/23 0023    Sharman Cheek, MD 05/28/23 [phone]

## 2023-06-25 ENCOUNTER — Telehealth: Payer: Self-pay | Admitting: Neurosurgery

## 2023-06-25 DIAGNOSIS — D329 Benign neoplasm of meninges, unspecified: Secondary | ICD-10-CM

## 2023-06-25 NOTE — Telephone Encounter (Addendum)
 Please let her know she is due for a repeat scan in February 2025 for her meningioma. Damien has her order and will work on the authorization and send a message to scheduling to contact her once it is approved. Once we have the results, we can determine scheduling an appointment w/ Dr Clois to go over the results.

## 2023-06-25 NOTE — Telephone Encounter (Signed)
 Patient was seen in 2023 by Dr.Yarbrough. She called our office to have a repeat MRI as it states on her last seen OV note but I did inform her we are not with [facility].  Patient would like to know should she start new with a PA or can she continue care with Dr.Y to have the MRI completed? Please advise

## 2023-06-26 NOTE — Telephone Encounter (Signed)
 Can you also make sure her insurance is correct since it is the beginning of the year.

## 2023-07-05 NOTE — Addendum Note (Signed)
Addended by: Ernie Hew on: 07/05/2023 11:59 AM   Modules accepted: Orders

## 2023-07-05 NOTE — Telephone Encounter (Signed)
MRI has been ordered and is due to be repeated in February 2025. I will send a message to scheduling and get this approved closer to the time.

## 2023-07-11 DIAGNOSIS — M7522 Bicipital tendinitis, left shoulder: Secondary | ICD-10-CM | POA: Diagnosis not present

## 2023-07-11 DIAGNOSIS — M7542 Impingement syndrome of left shoulder: Secondary | ICD-10-CM | POA: Diagnosis not present

## 2023-07-11 DIAGNOSIS — M7582 Other shoulder lesions, left shoulder: Secondary | ICD-10-CM | POA: Diagnosis not present

## 2023-07-12 ENCOUNTER — Encounter: Payer: Self-pay | Admitting: Internal Medicine

## 2023-07-12 ENCOUNTER — Ambulatory Visit: Payer: Medicare HMO | Admitting: Internal Medicine

## 2023-07-12 ENCOUNTER — Other Ambulatory Visit: Payer: Self-pay

## 2023-07-12 VITALS — BP 128/76 | HR 100 | Temp 97.8°F | Resp 16 | Ht 61.5 in | Wt 239.3 lb

## 2023-07-12 DIAGNOSIS — E7800 Pure hypercholesterolemia, unspecified: Secondary | ICD-10-CM

## 2023-07-12 DIAGNOSIS — M1712 Unilateral primary osteoarthritis, left knee: Secondary | ICD-10-CM | POA: Diagnosis not present

## 2023-07-12 DIAGNOSIS — K219 Gastro-esophageal reflux disease without esophagitis: Secondary | ICD-10-CM

## 2023-07-12 DIAGNOSIS — R7303 Prediabetes: Secondary | ICD-10-CM

## 2023-07-12 DIAGNOSIS — Z1231 Encounter for screening mammogram for malignant neoplasm of breast: Secondary | ICD-10-CM | POA: Diagnosis not present

## 2023-07-12 DIAGNOSIS — J309 Allergic rhinitis, unspecified: Secondary | ICD-10-CM

## 2023-07-12 DIAGNOSIS — B372 Candidiasis of skin and nail: Secondary | ICD-10-CM

## 2023-07-12 DIAGNOSIS — I10 Essential (primary) hypertension: Secondary | ICD-10-CM | POA: Diagnosis not present

## 2023-07-12 LAB — POCT GLYCOSYLATED HEMOGLOBIN (HGB A1C): Hemoglobin A1C: 5.7 % — AB (ref 4.0–5.6)

## 2023-07-12 MED ORDER — NYSTATIN 100000 UNIT/GM EX POWD: 1.0000 | Freq: Three times a day (TID) | CUTANEOUS | 1 refills | Status: AC

## 2023-07-12 MED ORDER — METHOCARBAMOL 500 MG PO TABS: 500.0000 mg | ORAL_TABLET | Freq: Every evening | ORAL | 1 refills | Status: DC | PRN

## 2023-07-12 MED ORDER — GABAPENTIN 300 MG PO CAPS: 300.0000 mg | ORAL_CAPSULE | Freq: Three times a day (TID) | ORAL | 1 refills | Status: DC

## 2023-07-12 MED ORDER — LEVOCETIRIZINE DIHYDROCHLORIDE 5 MG PO TABS: 5.0000 mg | ORAL_TABLET | Freq: Every evening | ORAL | 1 refills | Status: DC

## 2023-07-12 MED ORDER — ROSUVASTATIN CALCIUM 20 MG PO TABS: 20.0000 mg | ORAL_TABLET | Freq: Every day | ORAL | 1 refills | Status: DC

## 2023-07-12 MED ORDER — CHLORTHALIDONE 25 MG PO TABS: 12.5000 mg | ORAL_TABLET | Freq: Every day | ORAL | 0 refills | Status: AC

## 2023-07-12 MED ORDER — OMEPRAZOLE 20 MG PO CPDR: 20.0000 mg | DELAYED_RELEASE_CAPSULE | Freq: Every day | ORAL | 1 refills | Status: DC

## 2023-07-12 MED ORDER — MELOXICAM 15 MG PO TABS: 15.0000 mg | ORAL_TABLET | Freq: Every day | ORAL | 1 refills | Status: DC

## 2023-07-12 MED ORDER — AMLODIPINE BESYLATE 10 MG PO TABS: 10.0000 mg | ORAL_TABLET | Freq: Every day | ORAL | 1 refills | Status: DC
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
## 2023-07-12 NOTE — Assessment & Plan Note (Signed)
Still having ongoing arthritis issues, mainly in her knee and her back.  Refill gabapentin 300 mg 3 times daily, Mobic 15 mg daily and Robaxin 500 mg to take at bedtime as needed.

## 2023-07-12 NOTE — Assessment & Plan Note (Signed)
Now on Crestor 10 mg daily.  Plan to recheck fasting labs at follow-up.

## 2023-07-12 NOTE — Assessment & Plan Note (Signed)
Does not have currently but will get flares under her breasts during the summer.  Will refill nystatin powder

## 2023-07-12 NOTE — Assessment & Plan Note (Signed)
Stable, continue Xyzal, Flonase and as Astelin nasal sprays, refill sent to pharmacy.

## 2023-07-12 NOTE — Assessment & Plan Note (Signed)
Stable, doing well on Prilosec 20 mg daily, refilled today.

## 2023-07-12 NOTE — Assessment & Plan Note (Signed)
Improved, A1c 5.7% today.  Discussed lifestyle management.

## 2023-07-12 NOTE — Assessment & Plan Note (Signed)
 Blood pressure stable here today, no changes made to medications and appropriate refills sent to pharmacy.

## 2023-08-07 ENCOUNTER — Ambulatory Visit
Admission: RE | Admit: 2023-08-07 | Discharge: 2023-08-07 | Disposition: A | Discharge status: Home / Self Care | Payer: Medicare HMO | Source: Ambulatory Visit | Attending: Neurosurgery | Admitting: Neurosurgery

## 2023-08-07 DIAGNOSIS — D320 Benign neoplasm of cerebral meninges: Secondary | ICD-10-CM | POA: Diagnosis not present

## 2023-08-07 DIAGNOSIS — D329 Benign neoplasm of meninges, unspecified: Secondary | ICD-10-CM | POA: Insufficient documentation

## 2023-08-07 MED ORDER — GADOBUTROL 1 MMOL/ML IV SOLN
10.0000 mL | Freq: Once | INTRAVENOUS | Status: AC | PRN
  Administered 2023-08-07: 10 mL via INTRAVENOUS

## 2023-08-30 ENCOUNTER — Other Ambulatory Visit: Payer: Self-pay | Admitting: Neurosurgery

## 2023-08-30 ENCOUNTER — Encounter: Payer: Self-pay | Admitting: Neurosurgery

## 2023-08-30 DIAGNOSIS — D329 Benign neoplasm of meninges, unspecified: Secondary | ICD-10-CM

## 2023-10-22 ENCOUNTER — Telehealth: Payer: Self-pay

## 2023-10-22 NOTE — Telephone Encounter (Signed)
 Copied from CRM [id number]. Topic: Clinical - Medication Question >> Oct 22, 2023  4:15 PM Stanly Early wrote: Reason for CRM: yeast infection taking over counter cream and would a callback for some question they have [phone]

## 2023-11-09 ENCOUNTER — Ambulatory Visit
Admission: RE | Admit: 2023-11-09 | Discharge: 2023-11-09 | Disposition: A | Discharge status: Home / Self Care | Source: Ambulatory Visit | Attending: Internal Medicine | Admitting: Internal Medicine

## 2023-11-09 DIAGNOSIS — Z1231 Encounter for screening mammogram for malignant neoplasm of breast: Secondary | ICD-10-CM | POA: Diagnosis not present

## 2023-11-15 ENCOUNTER — Ambulatory Visit: Payer: Self-pay | Admitting: Internal Medicine

## 2023-12-04 ENCOUNTER — Other Ambulatory Visit: Payer: Self-pay | Admitting: Internal Medicine

## 2023-12-04 DIAGNOSIS — J019 Acute sinusitis, unspecified: Secondary | ICD-10-CM | POA: Diagnosis not present

## 2023-12-04 DIAGNOSIS — M1712 Unilateral primary osteoarthritis, left knee: Secondary | ICD-10-CM

## 2023-12-06 NOTE — Telephone Encounter (Signed)
 Requested Prescriptions  Refused Prescriptions Disp Refills   meloxicam  (MOBIC ) 15 MG tablet [Pharmacy Med Name: Meloxicam  15 MG Oral Tablet] 90 tablet 0    Sig: TAKE 1 TABLET BY MOUTH ONCE DAILY AT NOON     Analgesics:  COX2 Inhibitors Failed - 12/06/2023  2:09 PM      Failed - Manual Review: Labs are only required if the patient has taken medication for more than 8 weeks.      Failed - AST in normal range and within 360 days    AST  Date Value Ref Range Status  08/19/2021 17 0 - 37 U/L Final   SGOT(AST)  Date Value Ref Range Status  10/11/2013 31 15 - 37 Unit/L Final         Failed - ALT in normal range and within 360 days    ALT  Date Value Ref Range Status  08/19/2021 13 0 - 35 U/L Final   SGPT (ALT)  Date Value Ref Range Status  10/11/2013 23 12 - 78 U/L Final         Failed - eGFR is 30 or above and within 360 days    EGFR (African American)  Date Value Ref Range Status  10/11/2013 >60  Final   GFR calc Af Amer  Date Value Ref Range Status  08/09/2020 92 >59 mL/min/1.73 Final    Comment:    **In accordance with recommendations from the NKF-ASN Task force,**   [facility] is in the process of updating its eGFR calculation to the   2021 CKD-EPI creatinine equation that estimates kidney function   without a race variable.    EGFR (Non-African Amer.)  Date Value Ref Range Status  10/11/2013 >60  Final    Comment:    eGFR values <56mL/min/1.73 m2 may be an indication of chronic kidney disease (CKD). Calculated eGFR is useful in patients with stable renal function. The eGFR calculation will not be reliable in acutely ill patients when serum creatinine is changing rapidly. It is not useful in  patients on dialysis. The eGFR calculation may not be applicable to patients at the low and high extremes of body sizes, pregnant women, and vegetarians. POTASSIUM/AST - Slight hemolysis, interpret results with  - caution.    GFR, Estimated  Date Value Ref Range Status   05/09/2022 >60 >60 mL/min Final    Comment:    (NOTE) Calculated using the CKD-EPI Creatinine Equation (2021)    GFR  Date Value Ref Range Status  08/19/2021 90.63 >60.00 mL/min Final    Comment:    Calculated using the CKD-EPI Creatinine Equation (2021)   eGFR  Date Value Ref Range Status  04/29/2021 73 >59 mL/min/1.73 Final         Failed - Valid encounter within last 12 months    Recent Outpatient Visits           2 years ago Essential hypertension   [facility] Of [place]. [facility] Collins Dean, NP   3 years ago Bilateral primary osteoarthritis of knee   [facility] - A Dept Of [place]. [facility] Collins Dean, NP   3 years ago Essential hypertension   [facility] Of [place]. [facility] Collins Dean, NP   3 years ago Essential hypertension   [facility] - A Dept Of . [facility]  Valente Gaskin, RPH-CPP   3 years ago Essential hypertension   [facility] - [facility] Dept Of [place]. [facility] Collins Dean, NP       Future Appointments             In 1 month Rockney Cid, DO [facility]            Passed - HGB in normal range and within 360 days    Hemoglobin  Date Value Ref Range Status  01/09/2023 13.4 11.7 - 15.5 g/dL Final  16/03/9603 54.0 11.1 - 15.9 g/dL Final         Passed - Cr in normal range and within 360 days    Creat  Date Value Ref Range Status  01/09/2023 0.91 0.50 - 1.03 mg/dL Final         Passed - HCT in normal range and within 360 days    HCT  Date Value Ref Range Status  01/09/2023 40.6 35.0 - 45.0 % Final   Hematocrit  Date Value Ref Range Status  08/09/2020 40.4 34.0 - 46.6 % Final         Passed - Patient is not pregnant

## 2023-12-24 DIAGNOSIS — M1712 Unilateral primary osteoarthritis, left knee: Secondary | ICD-10-CM | POA: Diagnosis not present

## 2023-12-24 DIAGNOSIS — G8929 Other chronic pain: Secondary | ICD-10-CM | POA: Diagnosis not present

## 2024-01-10 ENCOUNTER — Ambulatory Visit: Payer: Self-pay | Admitting: Internal Medicine

## 2024-01-15 ENCOUNTER — Other Ambulatory Visit: Payer: Self-pay

## 2024-01-15 ENCOUNTER — Ambulatory Visit (INDEPENDENT_AMBULATORY_CARE_PROVIDER_SITE_OTHER): Admitting: Internal Medicine

## 2024-01-15 ENCOUNTER — Encounter: Payer: Self-pay | Admitting: Internal Medicine

## 2024-01-15 VITALS — BP 136/80 | HR 100 | Temp 97.7°F | Resp 16 | Ht 61.5 in | Wt 233.3 lb

## 2024-01-15 DIAGNOSIS — E7800 Pure hypercholesterolemia, unspecified: Secondary | ICD-10-CM

## 2024-01-15 DIAGNOSIS — M1712 Unilateral primary osteoarthritis, left knee: Secondary | ICD-10-CM | POA: Diagnosis not present

## 2024-01-15 DIAGNOSIS — K219 Gastro-esophageal reflux disease without esophagitis: Secondary | ICD-10-CM | POA: Diagnosis not present

## 2024-01-15 DIAGNOSIS — I10 Essential (primary) hypertension: Secondary | ICD-10-CM | POA: Diagnosis not present

## 2024-01-15 DIAGNOSIS — E559 Vitamin D deficiency, unspecified: Secondary | ICD-10-CM | POA: Diagnosis not present

## 2024-01-15 DIAGNOSIS — J309 Allergic rhinitis, unspecified: Secondary | ICD-10-CM | POA: Diagnosis not present

## 2024-01-15 DIAGNOSIS — R7303 Prediabetes: Secondary | ICD-10-CM | POA: Diagnosis not present

## 2024-01-15 MED ORDER — GABAPENTIN 300 MG PO CAPS: 300.0000 mg | ORAL_CAPSULE | Freq: Three times a day (TID) | ORAL | 1 refills | Status: AC

## 2024-01-15 MED ORDER — AMLODIPINE BESYLATE 10 MG PO TABS: 10.0000 mg | ORAL_TABLET | Freq: Every day | ORAL | 1 refills | Status: DC

## 2024-01-15 MED ORDER — ALBUTEROL SULFATE HFA 108 (90 BASE) MCG/ACT IN AERS: 1.0000 | INHALATION_SPRAY | Freq: Four times a day (QID) | RESPIRATORY_TRACT | 1 refills | Status: AC | PRN

## 2024-01-15 MED ORDER — LEVOCETIRIZINE DIHYDROCHLORIDE 5 MG PO TABS: 5.0000 mg | ORAL_TABLET | Freq: Every evening | ORAL | 1 refills | Status: DC

## 2024-01-15 MED ORDER — ROSUVASTATIN CALCIUM 20 MG PO TABS: 20.0000 mg | ORAL_TABLET | Freq: Every day | ORAL | 1 refills | Status: DC

## 2024-01-15 MED ORDER — OMEPRAZOLE 20 MG PO CPDR: 20.0000 mg | DELAYED_RELEASE_CAPSULE | Freq: Every day | ORAL | 1 refills | Status: DC

## 2024-01-15 MED ORDER — MELOXICAM 15 MG PO TABS: 15.0000 mg | ORAL_TABLET | Freq: Every day | ORAL | 1 refills | Status: AC

## 2024-01-15 MED ORDER — METHOCARBAMOL 500 MG PO TABS: 500.0000 mg | ORAL_TABLET | Freq: Every evening | ORAL | 1 refills | Status: AC | PRN
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
## 2024-01-16 ENCOUNTER — Ambulatory Visit: Payer: Self-pay | Admitting: Internal Medicine

## 2024-01-16 DIAGNOSIS — E559 Vitamin D deficiency, unspecified: Secondary | ICD-10-CM

## 2024-01-16 DIAGNOSIS — E7800 Pure hypercholesterolemia, unspecified: Secondary | ICD-10-CM

## 2024-01-16 LAB — COMPREHENSIVE METABOLIC PANEL WITH GFR
AG Ratio: 1.6 (calc) (ref 1.0–2.5)
ALT: 17 U/L (ref 6–29)
AST: 18 U/L (ref 10–35)
Albumin: 4.1 g/dL (ref 3.6–5.1)
Alkaline phosphatase (APISO): 96 U/L (ref 37–153)
BUN: 7 mg/dL (ref 7–25)
CO2: 26 mmol/L (ref 20–32)
Calcium: 9.8 mg/dL (ref 8.6–10.4)
Chloride: 104 mmol/L (ref 98–110)
Creat: 0.85 mg/dL (ref 0.50–1.03)
Globulin: 2.5 g/dL (ref 1.9–3.7)
Glucose, Bld: 95 mg/dL (ref 65–99)
Potassium: 4.2 mmol/L (ref 3.5–5.3)
Sodium: 141 mmol/L (ref 135–146)
Total Bilirubin: 0.4 mg/dL (ref 0.2–1.2)
Total Protein: 6.6 g/dL (ref 6.1–8.1)
eGFR: 79 mL/min/1.73m2 (ref >=60)

## 2024-01-16 LAB — CBC WITH DIFFERENTIAL/PLATELET
Absolute Lymphocytes: 2923 {cells}/uL (ref 850–3900)
Absolute Monocytes: 498 {cells}/uL (ref 200–950)
Basophils Absolute: 28 {cells}/uL (ref 0–200)
Basophils Relative: 0.5 %
Eosinophils Absolute: 50 {cells}/uL (ref 15–500)
Eosinophils Relative: 0.9 %
HCT: 45 % (ref 35.0–45.0)
Hemoglobin: 14.2 g/dL (ref 11.7–15.5)
MCH: 28.9 pg (ref 27.0–33.0)
MCHC: 31.6 g/dL — ABNORMAL LOW (ref 32.0–36.0)
MCV: 91.5 fL (ref 80.0–100.0)
MPV: 9.8 fL (ref 7.5–12.5)
Monocytes Relative: 8.9 %
Neutro Abs: 2100 {cells}/uL (ref 1500–7800)
Neutrophils Relative %: 37.5 %
Platelets: 358 Thousand/uL (ref 140–400)
RBC: 4.92 Million/uL (ref 3.80–5.10)
RDW: 13.5 % (ref 11.0–15.0)
Total Lymphocyte: 52.2 %
WBC: 5.6 Thousand/uL (ref 3.8–10.8)

## 2024-01-16 LAB — LIPID PANEL
Cholesterol: 298 mg/dL — ABNORMAL HIGH (ref <200)
HDL: 43 mg/dL — ABNORMAL LOW (ref >=50)
LDL Cholesterol (Calc): 210 mg/dL — ABNORMAL HIGH
Non-HDL Cholesterol (Calc): 255 mg/dL — ABNORMAL HIGH (ref <130)
Total CHOL/HDL Ratio: 6.9 (calc) — ABNORMAL HIGH (ref <5.0)
Triglycerides: 251 mg/dL — ABNORMAL HIGH (ref <150)

## 2024-01-16 LAB — VITAMIN D 25 HYDROXY (VIT D DEFICIENCY, FRACTURES): Vit D, 25-Hydroxy: 24 ng/mL — ABNORMAL LOW (ref 30–100)

## 2024-01-16 LAB — HEMOGLOBIN A1C
Hgb A1c MFr Bld: 6 % — ABNORMAL HIGH (ref <5.7)
Mean Plasma Glucose: 126 mg/dL
eAG (mmol/L): 7 mmol/L

## 2024-01-16 MED ORDER — ROSUVASTATIN CALCIUM 40 MG PO TABS: 40.0000 mg | ORAL_TABLET | Freq: Every day | ORAL | 3 refills | Status: AC

## 2024-01-16 MED ORDER — VITAMIN D (ERGOCALCIFEROL) 1.25 MG (50000 UNIT) PO CAPS: 50000.0000 [IU] | ORAL_CAPSULE | ORAL | 0 refills | Status: AC

## 2024-03-04 ENCOUNTER — Other Ambulatory Visit: Payer: Self-pay | Admitting: Internal Medicine

## 2024-03-04 DIAGNOSIS — J309 Allergic rhinitis, unspecified: Secondary | ICD-10-CM

## 2024-03-05 NOTE — Telephone Encounter (Signed)
 Rx 01/15/24 18g 1RF Requested Prescriptions  Pending Prescriptions Disp Refills   albuterol  (VENTOLIN  HFA) 108 (90 Base) MCG/ACT inhaler [Pharmacy Med Name: Albuterol  Sulfate HFA 108 (90 Base) MCG/ACT Inhalation Aerosol Solution] 9 g 0    Sig: INHALE 1 TO 2 PUFFS BY MOUTH EVERY 6 HOURS AS NEEDED FOR WHEEZING FOR SHORTNESS OF BREATH     Pulmonology:  Beta Agonists 2 Passed - 03/05/2024 12:42 PM      Passed - Last BP in normal range    BP Readings from Last 1 Encounters:  01/15/24 136/80         Passed - Last Heart Rate in normal range    Pulse Readings from Last 1 Encounters:  01/15/24 100         Passed - Valid encounter within last 12 months    Recent Outpatient Visits           1 month ago Essential hypertension   [facility] Bernardo Fend, DO   2 years ago Essential hypertension   [facility] Dept Of [place]. [facility] Theotis Haze ORN, NP   3 years ago Bilateral primary osteoarthritis of knee   [facility] Dept Of [place]. [place] [facility] Theotis Haze ORN, NP   3 years ago Essential hypertension   [facility] Of [place]. [facility] Theotis Haze ORN, NP   3 years ago Essential hypertension    [facility] Of [place]. [facility] Tonia Garnette LITTIE, RPH-CPP       Future Appointments             In 4 months Bernardo Fend, DO [facility] [place]

## 2024-05-14 DIAGNOSIS — Z59869 Financial insecurity, unspecified: Secondary | ICD-10-CM | POA: Diagnosis not present

## 2024-05-14 DIAGNOSIS — M170 Bilateral primary osteoarthritis of knee: Secondary | ICD-10-CM | POA: Diagnosis not present

## 2024-05-14 DIAGNOSIS — Z5941 Food insecurity: Secondary | ICD-10-CM | POA: Diagnosis not present

## 2024-05-30 ENCOUNTER — Ambulatory Visit

## 2024-05-30 VITALS — BP 136/80 | Ht 61.5 in | Wt 235.0 lb

## 2024-05-30 DIAGNOSIS — Z0000 Encounter for general adult medical examination without abnormal findings: Secondary | ICD-10-CM
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
## 2024-05-30 NOTE — Patient Instructions (Signed)
 Ms. Porr,  Thank you for taking the time for your Medicare Wellness Visit. I appreciate your continued commitment to your health goals. Please review the care plan we discussed, and feel free to reach out if I can assist you further.  Please note that Annual Wellness Visits do not include a physical exam. Some assessments may be limited, especially if the visit was conducted virtually. If needed, we may recommend an in-person follow-up with your provider.  Ongoing Care Seeing your primary care provider every 3 to 6 months helps us  monitor your health and provide consistent, personalized care.   Referrals If a referral was made during today's visit and you haven't received any updates within two weeks, please contact the referred provider directly to check on the status.  Recommended Screenings:  Health Maintenance  Topic Date Due   Hepatitis B Vaccine (1 of 3 - 19+ 3-dose series) Never done   Pneumococcal Vaccine for age over 68 (1 of 1 - PCV) Never done   DTaP/Tdap/Td vaccine (2 - Tdap) 01/28/2018   Pap with HPV screening  03/12/2023   COVID-19 Vaccine (1 - 2025-26 season) Never done   Medicare Annual Wellness Visit  02/29/2024   Breast Cancer Screening  11/08/2025   Hepatitis C Screening  Completed   HPV Vaccine  Aged Out   Meningitis B Vaccine  Aged Out   Flu Shot  Discontinued   Colon Cancer Screening  Discontinued   HIV Screening  Discontinued   Zoster (Shingles) Vaccine  Discontinued       05/26/2023    7:06 PM  Advanced Directives  Does Patient Have a Medical Advance Directive? No  Would patient like information on creating a medical advance directive? No - Patient declined    Vision: Annual vision screenings are recommended for early detection of glaucoma, cataracts, and diabetic retinopathy. These exams can also reveal signs of chronic conditions such as diabetes and high blood pressure.  Dental: Annual dental screenings help detect early signs of oral cancer, gum  disease, and other conditions linked to overall health, including heart disease and diabetes.  Please see the attached documents for additional preventive care recommendations.

## 2024-07-08 ENCOUNTER — Other Ambulatory Visit: Payer: Self-pay | Admitting: Internal Medicine

## 2024-07-08 DIAGNOSIS — K219 Gastro-esophageal reflux disease without esophagitis: Secondary | ICD-10-CM

## 2024-07-08 NOTE — Telephone Encounter (Signed)
 Requested Prescriptions  Pending Prescriptions Disp Refills   omeprazole  (PRILOSEC) 20 MG capsule [Pharmacy Med Name: Omeprazole  20 MG Oral Capsule Delayed Release] 90 capsule 0    Sig: Take 1 capsule by mouth once daily     Gastroenterology: Proton Pump Inhibitors Passed - 07/08/2024  2:54 PM      Passed - Valid encounter within last 12 months    Recent Outpatient Visits           5 months ago Essential hypertension   [facility] Bernardo Fend, DO   3 years ago Essential hypertension   [facility] [facility] [place] [facility] Dept Of [place]. [facility] Theotis Haze ORN, NP   3 years ago Bilateral primary osteoarthritis of knee   [place] [facility] [place] [facility] Dept Of [place]. [facility] Theotis Haze ORN, NP   4 years ago Essential hypertension   [place] [facility] [place] [facility] Dept Of [place]. [facility] Theotis Haze ORN, NP   4 years ago Essential hypertension    [facility] [place] - A Dept Of . [facility] Fleeta Tonia Garnette LITTIE, RPH-CPP       Future Appointments             In 1 week Bernardo Fend, DO [facility] [place]

## 2024-07-17 ENCOUNTER — Ambulatory Visit: Admitting: Internal Medicine

## 2024-07-20 ENCOUNTER — Other Ambulatory Visit: Payer: Self-pay | Admitting: Internal Medicine

## 2024-07-20 DIAGNOSIS — J309 Allergic rhinitis, unspecified: Secondary | ICD-10-CM

## 2024-07-20 DIAGNOSIS — I10 Essential (primary) hypertension: Secondary | ICD-10-CM

## 2024-07-22 ENCOUNTER — Ambulatory Visit: Admitting: Internal Medicine

## 2024-07-22 NOTE — Telephone Encounter (Signed)
 Requested Prescriptions  Pending Prescriptions Disp Refills   levocetirizine (XYZAL ) 5 MG tablet [Pharmacy Med Name: Levocetirizine Dihydrochloride  5 MG Oral Tablet] 90 tablet 0    Sig: TAKE 1 TABLET BY MOUTH ONCE DAILY IN THE EVENING     Ear, Nose, and Throat:  Antihistamines - levocetirizine dihydrochloride  Passed - 07/22/2024 10:13 AM      Passed - Cr in normal range and within 360 days    Creat  Date Value Ref Range Status  01/15/2024 0.85 0.50 - 1.03 mg/dL Final         Passed - eGFR is 10 or above and within 360 days    EGFR (African American)  Date Value Ref Range Status  10/11/2013 >60  Final   GFR calc Af Amer  Date Value Ref Range Status  08/09/2020 92 >59 mL/min/1.73 Final    Comment:    **In accordance with recommendations from the NKF-ASN Task force,**   [facility] is in the process of updating its eGFR calculation to the   2021 CKD-EPI creatinine equation that estimates kidney function   without a race variable.    EGFR (Non-African Amer.)  Date Value Ref Range Status  10/11/2013 >60  Final    Comment:    eGFR values <71mL/min/1.73 m2 may be an indication of chronic kidney disease (CKD). Calculated eGFR is useful in patients with stable renal function. The eGFR calculation will not be reliable in acutely ill patients when serum creatinine is changing rapidly. It is not useful in  patients on dialysis. The eGFR calculation may not be applicable to patients at the low and high extremes of body sizes, pregnant women, and vegetarians. POTASSIUM/AST - Slight hemolysis, interpret results with  - caution.    GFR, Estimated  Date Value Ref Range Status  05/09/2022 >60 >60 mL/min Final    Comment:    (NOTE) Calculated using the CKD-EPI Creatinine Equation (2021)    GFR  Date Value Ref Range Status  08/19/2021 90.63 >60.00 mL/min Final    Comment:    Calculated using the CKD-EPI Creatinine Equation (2021)   eGFR  Date Value Ref Range Status  01/15/2024 79 >  OR = 60 mL/min/1.60m2 Final  04/29/2021 73 >59 mL/min/1.73 Final         Passed - Valid encounter within last 12 months    Recent Outpatient Visits           6 months ago Essential hypertension   [facility] Bernardo Fend, DO   3 years ago Essential hypertension   [place] [facility] [place] [facility] Of [place]. [facility] Theotis Haze ORN, NP   3 years ago Bilateral primary osteoarthritis of knee   [place] [facility] [place] [facility] Dept Of [place]. [facility] Theotis Haze ORN, NP   4 years ago Essential hypertension   [place] [facility] [place] - [facility] Dept Of [place]. [facility] Theotis Haze ORN, NP   4 years ago Essential hypertension   [place] [facility] [place] [facility] Dept Of [place]. [facility] Fleeta Morris, Stephen L, RPH-CPP               amLODipine  (NORVASC ) 10 MG tablet [Pharmacy Med Name: amLODIPine  Besylate 10 MG Oral Tablet] 90 tablet 0    Sig: Take 1 tablet by mouth once daily     Cardiovascular: Calcium  Channel Blockers 2 Passed - 07/22/2024 10:13 AM  Passed - Last BP in normal range    BP Readings from Last 1 Encounters:  05/30/24 136/80         Passed - Last Heart Rate in normal range    Pulse Readings from Last 1 Encounters:  01/15/24 100         Passed - Valid encounter within last 6 months    Recent Outpatient Visits           6 months ago Essential hypertension   [facility] Bernardo Fend, DO   3 years ago Essential hypertension   [place] [facility] [place] [facility] Of [place]. [facility] Theotis Haze ORN, NP   3 years ago Bilateral primary osteoarthritis of knee   [place] [facility] [place] [facility] Dept Of [place]. [facility] Theotis Haze ORN, NP   4 years ago Essential hypertension   [place] [facility] [place] - [facility] Dept Of [place]. [facility] Theotis Haze ORN, NP   4 years ago  Essential hypertension   [place] [facility] [place] - A Dept Of [place]. [facility] Fleeta Tonia Garnette LITTIE, RPH-CPP

## 2024-08-20 ENCOUNTER — Ambulatory Visit: Admitting: Internal Medicine
